# Patient Record
Sex: Female | Born: 1990 | Race: White | Hispanic: No | Marital: Married | Smoking: Never smoker
Health system: Southern US, Community
[De-identification: ages and names within clinical notes are randomized; demographics above are authoritative.]

## PROBLEM LIST (undated history)

## (undated) ENCOUNTER — Inpatient Hospital Stay (HOSPITAL_COMMUNITY): Payer: Self-pay

## (undated) ENCOUNTER — Inpatient Hospital Stay: Payer: Self-pay

## (undated) DIAGNOSIS — N390 Urinary tract infection, site not specified: Secondary | ICD-10-CM

## (undated) DIAGNOSIS — R51 Headache: Secondary | ICD-10-CM

## (undated) DIAGNOSIS — K219 Gastro-esophageal reflux disease without esophagitis: Secondary | ICD-10-CM

## (undated) DIAGNOSIS — N3289 Other specified disorders of bladder: Secondary | ICD-10-CM

## (undated) DIAGNOSIS — K8020 Calculus of gallbladder without cholecystitis without obstruction: Secondary | ICD-10-CM

## (undated) HISTORY — PX: WISDOM TOOTH EXTRACTION: SHX21

## (undated) HISTORY — PX: TONSILLECTOMY: SUR1361

---
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
## 2013-03-19 LAB — OB RESULTS CONSOLE RUBELLA ANTIBODY, IGM: Rubella: IMMUNE

## 2013-03-19 LAB — OB RESULTS CONSOLE ABO/RH: "RH Type ": POSITIVE

## 2013-03-19 LAB — OB RESULTS CONSOLE HEPATITIS B SURFACE ANTIGEN: Hepatitis B Surface Ag: NEGATIVE

## 2013-03-19 LAB — OB RESULTS CONSOLE RPR: RPR: NONREACTIVE

## 2013-03-19 LAB — OB RESULTS CONSOLE ANTIBODY SCREEN: Antibody Screen: NEGATIVE

## 2013-03-19 LAB — OB RESULTS CONSOLE GC/CHLAMYDIA
Chlamydia: NEGATIVE
Gonorrhea: NEGATIVE

## 2013-04-07 ENCOUNTER — Other Ambulatory Visit: Payer: Self-pay | Admitting: Obstetrics and Gynecology

## 2013-07-10 ENCOUNTER — Encounter (HOSPITAL_COMMUNITY): Payer: Self-pay | Admitting: Family

## 2013-07-10 ENCOUNTER — Inpatient Hospital Stay (HOSPITAL_COMMUNITY)
Admission: AD | Admit: 2013-07-10 | Discharge: 2013-07-11 | Disposition: A | Discharge status: Home / Self Care | Payer: 59 | Source: Ambulatory Visit | Attending: Obstetrics and Gynecology | Admitting: Obstetrics and Gynecology

## 2013-07-10 DIAGNOSIS — Z209 Contact with and (suspected) exposure to unspecified communicable disease: Secondary | ICD-10-CM

## 2013-07-10 DIAGNOSIS — R1011 Right upper quadrant pain: Secondary | ICD-10-CM | POA: Insufficient documentation

## 2013-07-10 DIAGNOSIS — O99891 Other specified diseases and conditions complicating pregnancy: Secondary | ICD-10-CM | POA: Insufficient documentation

## 2013-07-10 DIAGNOSIS — R51 Headache: Secondary | ICD-10-CM | POA: Insufficient documentation

## 2013-07-10 DIAGNOSIS — Z2089 Contact with and (suspected) exposure to other communicable diseases: Secondary | ICD-10-CM | POA: Insufficient documentation

## 2013-07-10 HISTORY — DX: Urinary tract infection, site not specified: N39.0

## 2013-07-10 HISTORY — DX: Other specified disorders of bladder: N32.89

## 2013-07-10 LAB — COMPREHENSIVE METABOLIC PANEL
ALT: 9 U/L (ref 0–35)
AST: 13 U/L (ref 0–37)
CO2: 23 mEq/L (ref 19–32)
Chloride: 104 mEq/L (ref 96–112)
GFR calc Af Amer: >90 mL/min (ref >90)
GFR calc non Af Amer: >90 mL/min (ref >90)
Glucose, Bld: 98 mg/dL (ref 70–99)
Sodium: 138 mEq/L (ref 135–145)
Total Bilirubin: 0.1 mg/dL — ABNORMAL LOW (ref 0.3–1.2)

## 2013-07-10 LAB — CBC
Hemoglobin: 11.1 g/dL — ABNORMAL LOW (ref 12.0–15.0)
MCV: 94.5 fL (ref 78.0–100.0)
Platelets: 179 10*3/uL (ref 150–400)
RBC: 3.28 MIL/uL — ABNORMAL LOW (ref 3.87–5.11)
WBC: 9.4 10*3/uL (ref 4.0–10.5)

## 2013-07-10 LAB — URINALYSIS, ROUTINE W REFLEX MICROSCOPIC
Bilirubin Urine: NEGATIVE
Glucose, UA: NEGATIVE mg/dL
Hgb urine dipstick: NEGATIVE
Specific Gravity, Urine: >1.03 — ABNORMAL HIGH (ref 1.005–1.030)
pH: 6 (ref 5.0–8.0)

## 2013-07-10 NOTE — MAU Provider Note (Signed)
Chief Complaint:  Body Fluid Exposure and Abdominal Pain   First Provider Initiated Contact with Patient 07/10/13 2301      HPI: Melinda Evans is a 22 y.o. G1P0 at [redacted]w[redacted]d who presents to maternity admissions reporting: 1. Sharp right upper quadrant pain over the last week, worsening. 2. Throbbing generalized headache. 3. Direct, close exposure to child with hand-foot-mouth disease 2 days ago.  Child had fever but no lesions at the time of exposure. 4. Not feeling well over the past week to week and a half. Had nausea and vomiting last week, vomiting resolve. Mild nausea persists.  Denies fever, chills, rash, vision changes, contractions, leakage of fluid or vaginal bleeding. Good fetal movement. No history of hypertension for during pregnancy. Patient thinks she might have been told that she had gallbladder problems in the past.  Past Medical History: Past Medical History  Diagnosis Date  . UTI (lower urinary tract infection)   . Bladder spasms     Past obstetric history: OB History   Grav Para Term Preterm Abortions TAB SAB Ect Mult Living   1              # Outc Date GA Lbr Len/2nd Wgt Sex Del Anes PTL Lv   1 CUR               Past Surgical History: Past Surgical History  Procedure Laterality Date  . Tonsillectomy      Family History: History reviewed. No pertinent family history.  Social History: History  Substance Use Topics  . Smoking status: Never Smoker   . Smokeless tobacco: Never Used  . Alcohol Use: No    Allergies: No Known Allergies  Meds:  Prescriptions prior to admission  Medication Sig Dispense Refill  . Prenatal Vit-Fe Fumarate-FA (PRENATAL MULTIVITAMIN) TABS tablet Take 1 tablet by mouth daily at 12 noon.      . [DISCONTINUED] ACETAMINOPHEN-BUTALBITAL 50-325 MG TABS Take by mouth.        ROS: Pertinent findings in history of present illness.  Physical Exam  Blood pressure 110/79, pulse 91, temperature 98.2 F (36.8 C), temperature source  Oral, resp. rate 18, last menstrual period 01/17/2013. GENERAL: Well-developed, well-nourished female in no acute distress. No lesions on hands, feet or mouth. HEENT: normocephalic HEART: normal rate RESP: normal effort ABDOMEN: Soft, non-tender, gravid appropriate for gestational age. Negative Murphy sign. EXTREMITIES: Nontender, no edema NEURO: alert and oriented. Deep tendon reflexes 2+, no clonus SPECULUM EXAM: Deferred   FHT:  Baseline 145 , min-mod variability, accelerations present, no decelerations Contractions: none   Labs: Results for orders placed during the hospital encounter of 07/10/13 (from the past 24 hour(s))  PROTEIN / CREATININE RATIO, URINE     Status: None   Collection Time    07/10/13  8:32 PM      Result Value Range   Creatinine, Urine 133.56     Total Protein, Urine 10.9     PROTEIN CREATININE RATIO 0.08  0.00 - 0.15  URINALYSIS, ROUTINE W REFLEX MICROSCOPIC     Status: Abnormal   Collection Time    07/10/13  8:32 PM      Result Value Range   Color, Urine YELLOW  YELLOW   APPearance CLEAR  CLEAR   Specific Gravity, Urine >1.030 (*) 1.005 - 1.030   pH 6.0  5.0 - 8.0   Glucose, UA NEGATIVE  NEGATIVE mg/dL   Hgb urine dipstick NEGATIVE  NEGATIVE   Bilirubin Urine NEGATIVE  NEGATIVE  Ketones, ur NEGATIVE  NEGATIVE mg/dL   Protein, ur NEGATIVE  NEGATIVE mg/dL   Urobilinogen, UA 0.2  0.0 - 1.0 mg/dL   Nitrite NEGATIVE  NEGATIVE   Leukocytes, UA NEGATIVE  NEGATIVE  CBC     Status: Abnormal   Collection Time    07/10/13  9:25 PM      Result Value Range   WBC 9.4  4.0 - 10.5 K/uL   RBC 3.28 (*) 3.87 - 5.11 MIL/uL   Hemoglobin 11.1 (*) 12.0 - 15.0 g/dL   HCT 16.1 (*) 09.6 - 04.5 %   MCV 94.5  78.0 - 100.0 fL   MCH 33.8  26.0 - 34.0 pg   MCHC 35.8  30.0 - 36.0 g/dL   RDW 40.9  81.1 - 91.4 %   Platelets 179  150 - 400 K/uL  COMPREHENSIVE METABOLIC PANEL     Status: Abnormal   Collection Time    07/10/13  9:25 PM      Result Value Range   Sodium 138   135 - 145 mEq/L   Potassium 3.3 (*) 3.5 - 5.1 mEq/L   Chloride 104  96 - 112 mEq/L   CO2 23  19 - 32 mEq/L   Glucose, Bld 98  70 - 99 mg/dL   BUN 6  6 - 23 mg/dL   Creatinine, Ser 7.82 (*) 0.50 - 1.10 mg/dL   Calcium 9.0  8.4 - 95.6 mg/dL   Total Protein 6.0  6.0 - 8.3 g/dL   Albumin 2.8 (*) 3.5 - 5.2 g/dL   AST 13  0 - 37 U/L   ALT 9  0 - 35 U/L   Alkaline Phosphatase 68  39 - 117 U/L   Total Bilirubin 0.1 (*) 0.3 - 1.2 mg/dL   GFR calc non Af Amer >90  >90 mL/min   GFR calc Af Amer >90  >90 mL/min    Imaging:  N/A  MAU Course: Notified Dr. Huntley Dec of headache, epigastric pain, normal blood pressures, normal PIH labs and coxsackie virus exposure without symptoms.   The pain much better after Percocet. Nausea resolved with Zofran. No imaging needed at this time per Dr. Huntley Dec.  Assessment: 1. Other current maternal conditions classifiable elsewhere, antepartum   2. Infectious disease exposure   3. RUQ pain     Plan: Discharge home Preterm labor precautions, PIH precautions  and fetal kick counts. Coxsackievirus antibodies drawn. Strongly encouraged avoiding contact with sick child or other pregnant women until coxsackievirus virus antibody results are back. Your Dr. will be able to look up the results and determine if any further testing as needed.      Follow-up Information   Follow up with [facility] II,JAMES E, MD. (as scheduled or as needed if symptoms worsen)    Contact information:   [address] [place] [address] [phone]       Follow up with [facility] [place] MATERNITY ADMISSIONS. (As needed if symptoms worsen)    Contact information:   [address] [id number] [place] [address] [phone]       Medication List    STOP taking these medications       ACETAMINOPHEN-BUTALBITAL 50-325 MG Tabs      TAKE these medications       ondansetron 8 MG disintegrating tablet  Commonly known as:  ZOFRAN  ODT  Take 1 tablet (8 mg total) by mouth every 8 (eight) hours as needed for nausea.  oxyCODONE-acetaminophen 5-325 MG per tablet  Commonly known as:  PERCOCET/ROXICET  Take 1-2 tablets by mouth every 4 (four) hours as needed for pain.     prenatal multivitamin Tabs tablet  Take 1 tablet by mouth daily at 12 noon.       [place], CNM 07/11/2013 1:15 AM

## 2013-07-10 NOTE — MAU Note (Signed)
Patient presents to MAU with c/o possible exposure to hand-foot-mouth disease. Patient reports she has a relative diagnosed with hand-foot-mouth on Tuesday with whom she visited with that day.  Patient also reports persistent stabbing RUQ pain, headaches and ; advised by call RN at office to come in for evaluation. Denies visual disturbances.  Reports +FM; denies VB, LOF or contractions.

## 2013-07-11 MED ORDER — ONDANSETRON 8 MG PO TBDP: 8.0000 mg | ORAL_TABLET | Freq: Three times a day (TID) | ORAL | Status: DC | PRN

## 2013-07-11 MED ORDER — OXYCODONE-ACETAMINOPHEN 5-325 MG PO TABS
2.0000 | ORAL_TABLET | Freq: Once | ORAL | Status: AC
  Administered 2013-07-11: 2 via ORAL
  Filled 2013-07-11: qty 2

## 2013-07-11 MED ORDER — OXYCODONE-ACETAMINOPHEN 5-325 MG PO TABS: 1.0000 | ORAL_TABLET | ORAL | Status: DC | PRN

## 2013-07-15 LAB — COXSACKIE B VIRUS ANTIBODIES
Coxsackie B1 Ab: <1:8 {titer}
Coxsackie B2 Ab: <1:8 {titer}
Coxsackie B3 Ab: 1:8 {titer} — ABNORMAL HIGH
Coxsackie B4 Ab: <1:8 {titer}
Coxsackie B5 Ab: 1:16 {titer} — ABNORMAL HIGH

## 2013-07-30 ENCOUNTER — Other Ambulatory Visit (HOSPITAL_COMMUNITY): Payer: Self-pay | Admitting: Obstetrics and Gynecology

## 2013-07-30 DIAGNOSIS — R1011 Right upper quadrant pain: Secondary | ICD-10-CM

## 2013-07-31 ENCOUNTER — Ambulatory Visit (HOSPITAL_COMMUNITY)
Admission: RE | Admit: 2013-07-31 | Discharge: 2013-07-31 | Disposition: A | Discharge status: Home / Self Care | Payer: 59 | Source: Ambulatory Visit | Attending: Obstetrics and Gynecology | Admitting: Obstetrics and Gynecology

## 2013-07-31 DIAGNOSIS — R1011 Right upper quadrant pain: Secondary | ICD-10-CM

## 2013-07-31 DIAGNOSIS — R109 Unspecified abdominal pain: Secondary | ICD-10-CM | POA: Insufficient documentation

## 2013-07-31 DIAGNOSIS — O99891 Other specified diseases and conditions complicating pregnancy: Secondary | ICD-10-CM | POA: Insufficient documentation

## 2013-08-13 ENCOUNTER — Ambulatory Visit (INDEPENDENT_AMBULATORY_CARE_PROVIDER_SITE_OTHER): Payer: Self-pay | Admitting: General Surgery

## 2013-08-13 ENCOUNTER — Telehealth (INDEPENDENT_AMBULATORY_CARE_PROVIDER_SITE_OTHER): Payer: Self-pay | Admitting: General Surgery

## 2013-08-13 NOTE — Telephone Encounter (Signed)
Left call back number to be sent to pager at 3:24 on 08/13/13.Marland Kitchenask for [employer]

## 2013-08-13 NOTE — Telephone Encounter (Signed)
Tried calling patient 9/10@10 :16 to see why she was a no show but her only number that we have is a cell and the VM box is full so I couldn't leave a message.Marland KitchenMarland KitchenI will try calling the patient one more time this p.m. To see if I can get and answer and possibly get her in tomorrow in one of the open [place] slots per EW.Marland Kitchenafter looking at the notes from the referring doctor they wrote her appt down for 9/12@9 :45 so if I am unsuccessful at getting in touch with her I have a feeling she will probably show up them which EW is not in the office

## 2013-08-15 ENCOUNTER — Ambulatory Visit (INDEPENDENT_AMBULATORY_CARE_PROVIDER_SITE_OTHER): Payer: 59 | Admitting: General Surgery

## 2013-08-15 ENCOUNTER — Encounter (INDEPENDENT_AMBULATORY_CARE_PROVIDER_SITE_OTHER): Payer: Self-pay | Admitting: General Surgery

## 2013-08-15 VITALS — BP 108/64 | HR 92 | Resp 14 | Ht 62.0 in | Wt 151.8 lb

## 2013-08-15 DIAGNOSIS — R1011 Right upper quadrant pain: Secondary | ICD-10-CM

## 2013-08-15 NOTE — Progress Notes (Signed)
Subjective:     Patient ID: Melinda Evans, female   DOB: 1991/05/06, 22 y.o.   MRN: [id number]  HPI The patient is a 22 year old female who is referred by Dr. Jannet Mantis for evaluation of gallbladder issues. The patient states that approximately 20 weeks she began having right upper quadrant pain. The patient states that this is not particularly associated with meals. She does state the pain sometimes occur throughout the day and is worse at night when lying down. The patient also states she has a sensation of food getting stuck in her esophagus. She does state she's taking reflux medication at this time.  The patient also has undergone ultrasound which Reveals no gallstones, and otherwise no signs of chronic cholecystitis or acute cholecystitis. The patient's LFTs were within normal limits.  Review of Systems  Constitutional: Negative.   HENT: Negative.   Respiratory: Negative.   Cardiovascular: Negative.   Gastrointestinal: Negative.   Neurological: Negative.   All other systems reviewed and are negative.       Objective:   Physical Exam  Constitutional: She is oriented to person, place, and time. She appears well-developed and well-nourished.  HENT:  Head: Normocephalic and atraumatic.  Eyes: Conjunctivae and EOM are normal. Pupils are equal, round, and reactive to light.  Neck: Normal range of motion. Neck supple.  Cardiovascular: Normal rate, regular rhythm and normal heart sounds.   Pulmonary/Chest: Effort normal and breath sounds normal.  Abdominal: Soft. Bowel sounds are normal. There is tenderness (RUQ). There is no rebound and no guarding.  Musculoskeletal: Normal range of motion.  Neurological: She is alert and oriented to person, place, and time.       Assessment:     22 year old female 30 weeks gravid uterus.     Plan:     1. Discussed with patient symptoms of right upper quadrant pain likely related to her gravid uterus. Not so much that the fetus is pushing on her  ribs but more so that the uterus is portion of other organs. This may explain why in line down she feels more pain and right upper quadrant. 2. I discussed with her to eat more small frequent meals to help with the sensation of dysphagia.  He wished to continue with her PPIs at this time. 3. The patient in followup after delivery should her symptoms continue. At that time we would work her up for possible biliary dyskinesia and she will get a HIDA scan.

## 2013-08-25 ENCOUNTER — Encounter (INDEPENDENT_AMBULATORY_CARE_PROVIDER_SITE_OTHER): Payer: Self-pay

## 2013-09-29 ENCOUNTER — Inpatient Hospital Stay (HOSPITAL_COMMUNITY)
Admission: AD | Admit: 2013-09-29 | Discharge: 2013-09-29 | Disposition: A | Discharge status: Home / Self Care | Payer: 59 | Source: Ambulatory Visit | Attending: Obstetrics and Gynecology | Admitting: Obstetrics and Gynecology

## 2013-09-29 ENCOUNTER — Encounter (HOSPITAL_COMMUNITY): Payer: Self-pay | Admitting: *Deleted

## 2013-09-29 DIAGNOSIS — R109 Unspecified abdominal pain: Secondary | ICD-10-CM | POA: Insufficient documentation

## 2013-09-29 DIAGNOSIS — M549 Dorsalgia, unspecified: Secondary | ICD-10-CM | POA: Insufficient documentation

## 2013-09-29 DIAGNOSIS — O4700 False labor before 37 completed weeks of gestation, unspecified trimester: Secondary | ICD-10-CM | POA: Insufficient documentation

## 2013-09-29 HISTORY — DX: Calculus of gallbladder without cholecystitis without obstruction: K80.20

## 2013-09-29 LAB — URINALYSIS, ROUTINE W REFLEX MICROSCOPIC
Bilirubin Urine: NEGATIVE
Hgb urine dipstick: NEGATIVE
Ketones, ur: NEGATIVE mg/dL
Specific Gravity, Urine: 1.02 (ref 1.005–1.030)
Urobilinogen, UA: 0.2 mg/dL (ref 0.0–1.0)

## 2013-09-29 NOTE — MAU Provider Note (Signed)
Melinda Evans 22 y.o. G1P0 presents to MAU for what she thinks is ROM. Physical exam is difficult as patient is having much pressure. No pooling of fluids, cervix appears closed visually. Cervix is very posterior and patient is very uncomfortable.

## 2013-09-29 NOTE — MAU Note (Signed)
Patient states she has passed her mucus plug on 2023/10/18. States she has been having back and abdominal pain and pressure. Today at 1530 states she had clear fluid leaking down her legs. Has changed panties prior to coming to MAU. Reports good fetal movement. Having contractions about every 6-10 minutes.

## 2013-10-09 ENCOUNTER — Encounter (HOSPITAL_COMMUNITY): Payer: Self-pay | Admitting: *Deleted

## 2013-10-09 ENCOUNTER — Inpatient Hospital Stay (HOSPITAL_COMMUNITY)
Admission: AD | Admit: 2013-10-09 | Discharge: 2013-10-10 | Disposition: A | Discharge status: Home / Self Care | Payer: 59 | Source: Ambulatory Visit | Attending: Obstetrics and Gynecology | Admitting: Obstetrics and Gynecology

## 2013-10-09 DIAGNOSIS — O479 False labor, unspecified: Secondary | ICD-10-CM | POA: Insufficient documentation

## 2013-10-09 NOTE — Progress Notes (Signed)
Plan of care d/w Dr. Marcelle Overlie. Have pt walk for 1-2 hours and return to MAU. If no cervical change pt can be discharged home. Ambien if pt desires,  if discharged.

## 2013-10-09 NOTE — MAU Note (Signed)
Pt reports uc's q 3 minutes for 3 hours

## 2013-10-10 MED ORDER — ZOLPIDEM TARTRATE 5 MG PO TABS
5.0000 mg | ORAL_TABLET | Freq: Once | ORAL | Status: AC
  Administered 2013-10-10: 5 mg via ORAL
  Filled 2013-10-10: qty 1

## 2013-10-17 ENCOUNTER — Encounter (INDEPENDENT_AMBULATORY_CARE_PROVIDER_SITE_OTHER): Payer: Self-pay

## 2013-10-17 LAB — OB RESULTS CONSOLE GBS: GBS: NEGATIVE

## 2013-10-20 ENCOUNTER — Encounter (HOSPITAL_COMMUNITY): Payer: Self-pay | Admitting: *Deleted

## 2013-10-20 ENCOUNTER — Inpatient Hospital Stay (HOSPITAL_COMMUNITY)
Admission: AD | Admit: 2013-10-20 | Discharge: 2013-10-20 | Disposition: A | Discharge status: Home / Self Care | Payer: 59 | Source: Ambulatory Visit | Attending: Obstetrics and Gynecology | Admitting: Obstetrics and Gynecology

## 2013-10-20 DIAGNOSIS — O479 False labor, unspecified: Secondary | ICD-10-CM | POA: Insufficient documentation

## 2013-10-20 HISTORY — DX: Gastro-esophageal reflux disease without esophagitis: K21.9

## 2013-10-20 HISTORY — DX: Headache: R51

## 2013-10-21 ENCOUNTER — Encounter (HOSPITAL_COMMUNITY): Payer: Self-pay | Admitting: *Deleted

## 2013-10-21 ENCOUNTER — Inpatient Hospital Stay (HOSPITAL_COMMUNITY)
Admission: AD | Admit: 2013-10-21 | Discharge: 2013-10-23 | DRG: 775 | Disposition: A | Discharge status: Home / Self Care | Payer: 59 | Source: Ambulatory Visit | Attending: Obstetrics and Gynecology | Admitting: Obstetrics and Gynecology

## 2013-10-21 ENCOUNTER — Encounter (HOSPITAL_COMMUNITY): Payer: 59 | Admitting: Anesthesiology

## 2013-10-21 ENCOUNTER — Inpatient Hospital Stay (HOSPITAL_COMMUNITY): Payer: 59 | Admitting: Anesthesiology

## 2013-10-21 DIAGNOSIS — O99891 Other specified diseases and conditions complicating pregnancy: Secondary | ICD-10-CM | POA: Diagnosis present

## 2013-10-21 LAB — CBC
HCT: 33.8 % — ABNORMAL LOW (ref 36.0–46.0)
Hemoglobin: 11.7 g/dL — ABNORMAL LOW (ref 12.0–15.0)
MCH: 31 pg (ref 26.0–34.0)
MCV: 89.7 fL (ref 78.0–100.0)
RBC: 3.77 MIL/uL — ABNORMAL LOW (ref 3.87–5.11)

## 2013-10-21 MED ORDER — OXYTOCIN 40 UNITS IN LACTATED RINGERS INFUSION - SIMPLE MED
1.0000 m[IU]/min | INTRAVENOUS | Status: DC
  Administered 2013-10-21: 2 m[IU]/min via INTRAVENOUS

## 2013-10-21 MED ORDER — ZOLPIDEM TARTRATE 5 MG PO TABS: 5.0000 mg | ORAL_TABLET | Freq: Every evening | ORAL | Status: DC | PRN

## 2013-10-21 MED ORDER — ONDANSETRON HCL 4 MG/2ML IJ SOLN
4.0000 mg | Freq: Four times a day (QID) | INTRAMUSCULAR | Status: DC | PRN
  Administered 2013-10-21: 4 mg via INTRAVENOUS
  Filled 2013-10-21: qty 2

## 2013-10-21 MED ORDER — TETANUS-DIPHTH-ACELL PERTUSSIS 5-2.5-18.5 LF-MCG/0.5 IM SUSP: 0.5000 mL | Freq: Once | INTRAMUSCULAR | Status: DC

## 2013-10-21 MED ORDER — IBUPROFEN 600 MG PO TABS
600.0000 mg | ORAL_TABLET | Freq: Four times a day (QID) | ORAL | Status: DC
  Administered 2013-10-22 – 2013-10-23 (×7): 600 mg via ORAL
  Filled 2013-10-21 (×7): qty 1

## 2013-10-21 MED ORDER — FENTANYL 2.5 MCG/ML BUPIVACAINE 1/10 % EPIDURAL INFUSION (WH - ANES)
14.0000 mL/h | INTRAMUSCULAR | Status: DC | PRN
  Administered 2013-10-21: 14 mL/h via EPIDURAL
  Filled 2013-10-21 (×2): qty 125

## 2013-10-21 MED ORDER — LACTATED RINGERS IV SOLN: 500.0000 mL | Freq: Once | INTRAVENOUS | Status: DC

## 2013-10-21 MED ORDER — LACTATED RINGERS IV SOLN: INTRAVENOUS | Status: DC

## 2013-10-21 MED ORDER — OXYCODONE-ACETAMINOPHEN 5-325 MG PO TABS
1.0000 | ORAL_TABLET | ORAL | Status: DC | PRN
  Administered 2013-10-21 – 2013-10-22 (×7): 1 via ORAL
  Administered 2013-10-23: 2 via ORAL
  Administered 2013-10-23 (×2): 1 via ORAL
  Administered 2013-10-23: 2 via ORAL
  Filled 2013-10-21 (×4): qty 1
  Filled 2013-10-21: qty 2
  Filled 2013-10-21 (×3): qty 1
  Filled 2013-10-21: qty 2
  Filled 2013-10-21 (×2): qty 1

## 2013-10-21 MED ORDER — ONDANSETRON HCL 4 MG/2ML IJ SOLN: 4.0000 mg | INTRAMUSCULAR | Status: DC | PRN

## 2013-10-21 MED ORDER — DIPHENHYDRAMINE HCL 50 MG/ML IJ SOLN
12.5000 mg | INTRAMUSCULAR | Status: DC | PRN
  Administered 2013-10-21: 12.5 mg via INTRAVENOUS
  Filled 2013-10-21: qty 1

## 2013-10-21 MED ORDER — ONDANSETRON HCL 4 MG PO TABS: 4.0000 mg | ORAL_TABLET | ORAL | Status: DC | PRN

## 2013-10-21 MED ORDER — EPHEDRINE 5 MG/ML INJ
10.0000 mg | INTRAVENOUS | Status: DC | PRN
  Filled 2013-10-21: qty 2

## 2013-10-21 MED ORDER — DIBUCAINE 1 % RE OINT
1.0000 "application " | TOPICAL_OINTMENT | RECTAL | Status: DC | PRN
  Administered 2013-10-22: 1 via RECTAL
  Filled 2013-10-21: qty 28

## 2013-10-21 MED ORDER — WITCH HAZEL-GLYCERIN EX PADS: 1.0000 "application " | MEDICATED_PAD | CUTANEOUS | Status: DC | PRN

## 2013-10-21 MED ORDER — OXYTOCIN 40 UNITS IN LACTATED RINGERS INFUSION - SIMPLE MED
62.5000 mL/h | INTRAVENOUS | Status: DC
  Administered 2013-10-21: 62.5 mL/h via INTRAVENOUS
  Filled 2013-10-21: qty 1000

## 2013-10-21 MED ORDER — SIMETHICONE 80 MG PO CHEW: 80.0000 mg | CHEWABLE_TABLET | ORAL | Status: DC | PRN

## 2013-10-21 MED ORDER — SENNOSIDES-DOCUSATE SODIUM 8.6-50 MG PO TABS
2.0000 | ORAL_TABLET | ORAL | Status: DC
  Administered 2013-10-22 (×2): 2 via ORAL
  Filled 2013-10-21 (×2): qty 2

## 2013-10-21 MED ORDER — PHENYLEPHRINE 40 MCG/ML (10ML) SYRINGE FOR IV PUSH (FOR BLOOD PRESSURE SUPPORT)
80.0000 ug | PREFILLED_SYRINGE | INTRAVENOUS | Status: DC | PRN
  Filled 2013-10-21: qty 2
  Filled 2013-10-21: qty 10

## 2013-10-21 MED ORDER — BISACODYL 10 MG RE SUPP: 10.0000 mg | Freq: Every day | RECTAL | Status: DC | PRN

## 2013-10-21 MED ORDER — PRENATAL MULTIVITAMIN CH
1.0000 | ORAL_TABLET | Freq: Every day | ORAL | Status: DC
  Administered 2013-10-22 – 2013-10-23 (×2): 1 via ORAL
  Filled 2013-10-21 (×2): qty 1

## 2013-10-21 MED ORDER — PHENYLEPHRINE 40 MCG/ML (10ML) SYRINGE FOR IV PUSH (FOR BLOOD PRESSURE SUPPORT)
80.0000 ug | PREFILLED_SYRINGE | INTRAVENOUS | Status: DC | PRN
  Filled 2013-10-21: qty 2

## 2013-10-21 MED ORDER — LANOLIN HYDROUS EX OINT: TOPICAL_OINTMENT | CUTANEOUS | Status: DC | PRN

## 2013-10-21 MED ORDER — FENTANYL 2.5 MCG/ML BUPIVACAINE 1/10 % EPIDURAL INFUSION (WH - ANES)
INTRAMUSCULAR | Status: DC | PRN
  Administered 2013-10-21: 14 mL/h via EPIDURAL

## 2013-10-21 MED ORDER — LIDOCAINE HCL (PF) 1 % IJ SOLN
INTRAMUSCULAR | Status: DC | PRN
  Administered 2013-10-21 (×2): 9 mL

## 2013-10-21 MED ORDER — DIPHENHYDRAMINE HCL 25 MG PO CAPS: 25.0000 mg | ORAL_CAPSULE | Freq: Four times a day (QID) | ORAL | Status: DC | PRN

## 2013-10-21 MED ORDER — IBUPROFEN 600 MG PO TABS
600.0000 mg | ORAL_TABLET | Freq: Four times a day (QID) | ORAL | Status: DC | PRN
  Administered 2013-10-21: 600 mg via ORAL
  Filled 2013-10-21: qty 1

## 2013-10-21 MED ORDER — FLEET ENEMA 7-19 GM/118ML RE ENEM: 1.0000 | ENEMA | Freq: Every day | RECTAL | Status: DC | PRN

## 2013-10-21 MED ORDER — FLEET ENEMA 7-19 GM/118ML RE ENEM: 1.0000 | ENEMA | RECTAL | Status: DC | PRN

## 2013-10-21 MED ORDER — EPHEDRINE 5 MG/ML INJ
10.0000 mg | INTRAVENOUS | Status: DC | PRN
  Filled 2013-10-21: qty 2
  Filled 2013-10-21: qty 4

## 2013-10-21 MED ORDER — OXYTOCIN BOLUS FROM INFUSION: 500.0000 mL | INTRAVENOUS | Status: DC

## 2013-10-21 MED ORDER — BENZOCAINE-MENTHOL 20-0.5 % EX AERO
1.0000 "application " | INHALATION_SPRAY | CUTANEOUS | Status: DC | PRN
  Administered 2013-10-21: 1 via TOPICAL
  Filled 2013-10-21: qty 56

## 2013-10-21 MED ORDER — LACTATED RINGERS IV SOLN: 500.0000 mL | INTRAVENOUS | Status: DC | PRN

## 2013-10-21 MED ORDER — OXYCODONE-ACETAMINOPHEN 5-325 MG PO TABS: 1.0000 | ORAL_TABLET | ORAL | Status: DC | PRN

## 2013-10-21 MED ORDER — LIDOCAINE HCL (PF) 1 % IJ SOLN
30.0000 mL | INTRAMUSCULAR | Status: DC | PRN
  Filled 2013-10-21 (×2): qty 30

## 2013-10-21 MED ORDER — CITRIC ACID-SODIUM CITRATE 334-500 MG/5ML PO SOLN: 30.0000 mL | ORAL | Status: DC | PRN

## 2013-10-21 MED ORDER — TERBUTALINE SULFATE 1 MG/ML IJ SOLN: 0.2500 mg | Freq: Once | INTRAMUSCULAR | Status: DC | PRN

## 2013-10-21 MED ORDER — ACETAMINOPHEN 325 MG PO TABS: 650.0000 mg | ORAL_TABLET | ORAL | Status: DC | PRN

## 2013-10-21 NOTE — H&P (Signed)
Melinda Evans is a 22 y.o. female presenting for UCs and a question of SROM last night. Has had some RUQ pain on/off in pregnancy with normal labs and normal RUQ U/S. No HA, no vision change. Maternal Medical History:  Reason for admission: Contractions.   Contractions: Onset was 3-5 hours ago.    Fetal activity: Perceived fetal activity is normal.      OB History   Grav Para Term Preterm Abortions TAB SAB Ect Mult Living   1         0     Past Medical History  Diagnosis Date  . UTI (lower urinary tract infection)   . Bladder spasms   . Gall stones   . Headache(784.0)   . GERD (gastroesophageal reflux disease)    Past Surgical History  Procedure Laterality Date  . Tonsillectomy    . Wisdom tooth extraction     Family History: family history includes Diabetes in her maternal grandmother and mother; Heart disease in her maternal grandfather; Hypertension in her maternal grandmother and paternal aunt. Social History:  reports that she has never smoked. She has never used smokeless tobacco. She reports that she does not drink alcohol or use illicit drugs.   Prenatal Transfer Tool  Maternal Diabetes: No Genetic Screening: Normal Maternal Ultrasounds/Referrals: Normal Fetal Ultrasounds or other Referrals:  None Maternal Substance Abuse:  No Significant Maternal Medications:  None Significant Maternal Lab Results:  None Other Comments:  None  Review of Systems  Eyes: Negative for blurred vision.  Gastrointestinal: Negative for abdominal pain.  Neurological: Negative for headaches.    Dilation: 4.5 Effacement (%): 90 Station: -1 Exam by:: Dr. Henderson Cloud Blood pressure 126/89, pulse 83, temperature 97.7 F (36.5 C), temperature source Oral, resp. rate 20, height 5' 2.25" (1.581 m), weight 160 lb (72.576 kg), last menstrual period 01/17/2013, SpO2 98.00%. Maternal Exam:  Uterine Assessment: Contraction strength is firm.  Contraction frequency is regular.   Abdomen: Fetal  presentation: vertex     Fetal Exam Fetal State Assessment: Category I - tracings are normal.     Physical Exam  Cardiovascular: Normal rate and regular rhythm.   Respiratory: Effort normal.  GI: Soft. There is no tenderness.  Neurological: She has normal reflexes.    Prenatal labs: ABO, Rh: O/Positive/-- (04/16 0000) Antibody: Negative (04/16 0000) Rubella: Immune (04/16 0000) RPR: Nonreactive (04/16 0000)  HBsAg: Negative (04/16 0000)  HIV: Non-reactive (04/16 0000)  GBS: Negative (11/14 0000)   Assessment/Plan: 22 yo G1P0 at 39 4/7 weeks in active labor Recheck in 1-2 hours D/W patient and her mother above, all questions answered   Jermaine Neuharth II,Ahmeer Tuman E 10/21/2013, 7:57 AM

## 2013-10-21 NOTE — Anesthesia Procedure Notes (Signed)
Epidural Patient location during procedure: OB Start time: 10/21/2013 6:04 AM End time: 10/21/2013 6:08 AM  Staffing Anesthesiologist: Leilani Able Performed by: anesthesiologist   Preanesthetic Checklist Completed: patient identified, surgical consent, pre-op evaluation, timeout performed, IV checked, risks and benefits discussed and monitors and equipment checked  Epidural Patient position: sitting Prep: site prepped and draped and DuraPrep Patient monitoring: continuous pulse ox and blood pressure Approach: midline Injection technique: LOR air  Needle:  Needle type: Tuohy  Needle gauge: 17 G Needle length: 9 cm and 9 Needle insertion depth: 6 cm Catheter type: closed end flexible Catheter size: 19 Gauge Catheter at skin depth: 11 cm Test dose: negative and Other  Assessment Sensory level: T9 Events: blood not aspirated, injection not painful, no injection resistance, negative IV test and no paresthesia  Additional Notes Reason for block:procedure for pain

## 2013-10-21 NOTE — Progress Notes (Signed)
Cx 7/C/-2/caput FHT + accels, good response to scalp stim UCs q2-4 min

## 2013-10-21 NOTE — Progress Notes (Signed)
Delivery of live viable female by Dr Henderson Cloud, Melvyn Novas 9,9

## 2013-10-21 NOTE — Progress Notes (Signed)
Delivery Note At 6:25 PM a viable female was delivered via Vaginal, Spontaneous Delivery (Presentation: Left Occiput Anterior).  APGAR: 9, 9; weight .   Placenta status: Intact, Spontaneous.  Cord: 3 vessels with the following complications: None.  Cord pH: pending  Anesthesia: Epidural  Episiotomy: None Lacerations: bilat 2nd degree;Perineal Suture Repair: vicryl rapide Est. Blood Loss (mL): 300  Mom to postpartum.  Baby to Couplet care / Skin to Skin.  Clearance Chenault II,May Manrique E 10/21/2013, 6:47 PM

## 2013-10-21 NOTE — Anesthesia Preprocedure Evaluation (Signed)
Anesthesia Evaluation  Patient identified by MRN, date of birth, ID band Patient awake    Reviewed: Allergy & Precautions, H&P , NPO status , Patient's Chart, lab work & pertinent test results  Airway Mallampati: I TM Distance: >3 FB Neck ROM: full    Dental no notable dental hx.    Pulmonary neg pulmonary ROS,    Pulmonary exam normal       Cardiovascular negative cardio ROS      Neuro/Psych negative psych ROS   GI/Hepatic Neg liver ROS,   Endo/Other  negative endocrine ROS  Renal/GU negative Renal ROS     Musculoskeletal   Abdominal Normal abdominal exam  (+)   Peds  Hematology negative hematology ROS (+)   Anesthesia Other Findings   Reproductive/Obstetrics (+) Pregnancy                           Anesthesia Physical Anesthesia Plan  ASA: II  Anesthesia Plan: Epidural   Post-op Pain Management:    Induction:   Airway Management Planned:   Additional Equipment:   Intra-op Plan:   Post-operative Plan:   Informed Consent: I have reviewed the patients History and Physical, chart, labs and discussed the procedure including the risks, benefits and alternatives for the proposed anesthesia with the patient or authorized representative who has indicated his/her understanding and acceptance.     Plan Discussed with:   Anesthesia Plan Comments:         Anesthesia Quick Evaluation

## 2013-10-21 NOTE — MAU Note (Signed)
PT SAYS SHE WAS HERE  AT 9PM- SENT HOME  - VE  4 CM.   AT HOME COULD NOT SLEEP.   DENIES HSV AND MRSA.

## 2013-10-21 NOTE — Progress Notes (Signed)
Cx no change IUPC placed Will begin pitocin augmentation D/W patient risks including fetal distress and emergent c/s All questions answered

## 2013-10-22 LAB — CBC
HCT: 31.4 % — ABNORMAL LOW (ref 36.0–46.0)
Hemoglobin: 10.7 g/dL — ABNORMAL LOW (ref 12.0–15.0)
MCH: 30.7 pg (ref 26.0–34.0)
MCHC: 34.1 g/dL (ref 30.0–36.0)
RBC: 3.48 MIL/uL — ABNORMAL LOW (ref 3.87–5.11)
RDW: 13.5 % (ref 11.5–15.5)

## 2013-10-22 NOTE — Anesthesia Postprocedure Evaluation (Signed)
  Anesthesia Post-op Note  Patient: Melinda Evans  Procedure(s) Performed: * No procedures listed *  Patient Location: Mother/Baby  Anesthesia Type:Epidural  Level of Consciousness: awake  Airway and Oxygen Therapy: Patient Spontanous Breathing  Post-op Pain: none  Post-op Assessment: Patient's Cardiovascular Status Stable, Respiratory Function Stable, Patent Airway, No signs of Nausea or vomiting, Adequate PO intake, Pain level controlled, No headache, No backache, No residual numbness and No residual motor weakness  Post-op Vital Signs: Reviewed and stable  Complications: No apparent anesthesia complications

## 2013-10-22 NOTE — Lactation Note (Addendum)
This note was copied from the chart of Melinda Reet Scharrer. Lactation Consultation Note    Initial consult with this first time mom and baby. I assisted mom with football hold, and Lily latched easily. She was sleepy, and suckled softly for 10 minutes, and then fell asleep. I woke her up to do teaching with mom, so she was no showing cues prior to latch. Teaching done with mom from the Baby and Me book, and lactation services reviewed with mom also. The baby is just 15 hours old, and has had multiple wet and dirty diapers, and is transitioning her stool to green, loose. I noted when the baby elevates her tongue, it is a bowl shape. Her frenulum is visible toward the tip of her tongue, and with extension it does cleft slightly. Mom reports some nipple soreness with latching today, but with this latch, mom reports comfort, and with unjatch, mom nipple appears the same as before latch (no pinching). Mom aware to watch for deep latch, and report any nipple pain or discomfort.  Mom knows to call for questions/concerns.  Patient Name: Melinda Evans Today's Date: 10/22/2013 Reason for consult: Initial assessment   Maternal Data Formula Feeding for Exclusion: No Infant to breast within first hour of birth: Yes Has patient been taught Hand Expression?: Yes Does the patient have breastfeeding experience prior to this delivery?: No  Feeding Feeding Type: Breast Fed Length of feed: 10 min  LATCH Score/Interventions Latch: Grasps breast easily, tongue down, lips flanged, rhythmical sucking.  Audible Swallowing: None Intervention(s): Skin to skin;Hand expression  Type of Nipple: Everted at rest and after stimulation  Comfort (Breast/Nipple): Soft / non-tender     Hold (Positioning): Assistance needed to correctly position infant at breast and maintain latch. Intervention(s): Breastfeeding basics reviewed;Support Pillows;Position options;Skin to skin  LATCH Score: 7  Lactation Tools  Discussed/Used     Consult Status Consult Status: Follow-up Date: 10/23/13 Follow-up type: In-patient    Alfred Levins 10/22/2013, 4:39 PM

## 2013-10-22 NOTE — Progress Notes (Signed)
Patient was referred for history of depression/anxiety.  * Referral screened out by Clinical Social Worker because none of the following criteria appear to apply:  ~ History of anxiety/depression during this pregnancy, or of post-partum depression.  ~ Diagnosis of anxiety and/or depression within last 3 years  ~ History of depression due to pregnancy loss/loss of child  OR  * Patient's symptoms currently being treated with medication (Xanax, PRN) and/or therapy, per pt.  Please contact the Clinical Social Worker if needs arise, or by the patient's request.

## 2013-10-22 NOTE — Progress Notes (Signed)
Post Partum Day 1 Subjective: no complaints, up ad lib, voiding and tolerating PO  Objective: Blood pressure 117/72, pulse 78, temperature 97.8 F (36.6 C), temperature source Oral, resp. rate 18, height 5' 2.25" (1.581 m), weight 160 lb (72.576 kg), last menstrual period 01/17/2013, SpO2 98.00%, unknown if currently breastfeeding.  Physical Exam:  General: alert and cooperative Lochia: appropriate Uterine Fundus: firm Incision: perineum intact DVT Evaluation: No evidence of DVT seen on physical exam. Negative Homan's sign. No cords or calf tenderness. No significant calf/ankle edema.   Recent Labs  10/21/13 0507 10/22/13 0600  HGB 11.7* 10.7*  HCT 33.8* 31.4*    Assessment/Plan: Plan for discharge tomorrow   LOS: 1 day   Camryn Quesinberry G 10/22/2013, 8:22 AM

## 2013-10-23 MED ORDER — IBUPROFEN 600 MG PO TABS: 600.0000 mg | ORAL_TABLET | Freq: Four times a day (QID) | ORAL | Status: DC

## 2013-10-23 MED ORDER — OXYCODONE-ACETAMINOPHEN 5-325 MG PO TABS
1.0000 | ORAL_TABLET | ORAL | Status: DC | PRN
Start: 2013-10-23 — End: 2015-04-22

## 2013-10-23 NOTE — Discharge Summary (Signed)
Obstetric Discharge Summary Reason for Admission: rupture of membranes Prenatal Procedures: ultrasound Intrapartum Procedures: spontaneous vaginal delivery Postpartum Procedures: none Complications-Operative and Postpartum: 2 degree perineal laceration Hemoglobin  Date Value Range Status  10/22/2013 10.7* 12.0 - 15.0 g/dL Final     HCT  Date Value Range Status  10/22/2013 31.4* 36.0 - 46.0 % Final    Physical Exam:  General: alert and cooperative Lochia: appropriate Uterine Fundus: firm Incision: perineum intact DVT Evaluation: No evidence of DVT seen on physical exam. Negative Homan's sign. No cords or calf tenderness. No significant calf/ankle edema.  Discharge Diagnoses: Term Pregnancy-delivered  Discharge Information: Date: 10/23/2013 Activity: pelvic rest Diet: routine Medications: PNV, Ibuprofen and Percocet Condition: stable Instructions: refer to practice specific booklet Discharge to: home   Newborn Data: Live born female  Birth Weight: 7 lb 11.5 oz (3501 g) APGAR: 9, 9  Home with mother.  Melinda Evans G 10/23/2013, 8:29 AM

## 2014-01-06 ENCOUNTER — Other Ambulatory Visit: Payer: Self-pay | Admitting: Obstetrics and Gynecology

## 2014-10-05 ENCOUNTER — Encounter (HOSPITAL_COMMUNITY): Payer: Self-pay | Admitting: *Deleted

## 2014-11-02 LAB — OB RESULTS CONSOLE RPR: RPR: NONREACTIVE

## 2014-11-02 LAB — OB RESULTS CONSOLE ABO/RH: RH TYPE: POSITIVE

## 2014-11-02 LAB — OB RESULTS CONSOLE ANTIBODY SCREEN: Antibody Screen: NEGATIVE

## 2014-11-02 LAB — OB RESULTS CONSOLE HEPATITIS B SURFACE ANTIGEN: Hepatitis B Surface Ag: NEGATIVE

## 2014-11-02 LAB — OB RESULTS CONSOLE RUBELLA ANTIBODY, IGM: RUBELLA: IMMUNE

## 2014-11-02 LAB — OB RESULTS CONSOLE HIV ANTIBODY (ROUTINE TESTING): HIV: NONREACTIVE

## 2014-11-17 ENCOUNTER — Other Ambulatory Visit: Payer: Self-pay | Admitting: Obstetrics and Gynecology

## 2014-11-17 LAB — OB RESULTS CONSOLE GC/CHLAMYDIA
Chlamydia: NEGATIVE
Gonorrhea: NEGATIVE

## 2014-11-18 LAB — CYTOLOGY - PAP

## 2014-12-04 NOTE — L&D Delivery Note (Cosign Needed)
Delivery Note At 4:35 AM a viable female was delivered via Vaginal, Spontaneous Delivery (Presentation: Left Occiput Anterior) by nurses right before I walked in after being called for stand-by birth for Dr. Henderson Cloudomblin after rapid progression.  APGAR: 9, 9; weight: pending at time of note.  Infant w/ spontaneous cry and respiration placed on mom's abdomen, cord clamped x 2 & cut by FOB.  Placenta status: delivered spontaneously intact.  Cord: 3 vessels with the following complications: None.    Anesthesia: Epidural  Episiotomy: None Lacerations:  intact Suture Repair: n/a Est. Blood Loss (mL):  200ml  Mom to postpartum.  Baby to Couplet care / Skin to Skin.  Marge DuncansBooker, Kimberly Randall 04/25/2015, 4:51 AM

## 2015-04-12 LAB — OB RESULTS CONSOLE GBS: GBS: POSITIVE

## 2015-04-22 ENCOUNTER — Inpatient Hospital Stay (HOSPITAL_COMMUNITY)
Admission: AD | Admit: 2015-04-22 | Discharge: 2015-04-22 | DRG: 780 | Disposition: A | Discharge status: Home / Self Care | Payer: Medicaid Other | Source: Ambulatory Visit | Attending: Obstetrics and Gynecology | Admitting: Obstetrics and Gynecology

## 2015-04-22 ENCOUNTER — Encounter (HOSPITAL_COMMUNITY): Payer: Self-pay | Admitting: *Deleted

## 2015-04-22 ENCOUNTER — Inpatient Hospital Stay (HOSPITAL_COMMUNITY)
Admission: AD | Admit: 2015-04-22 | Discharge: 2015-04-23 | Disposition: A | Discharge status: Home / Self Care | Payer: 59 | Source: Ambulatory Visit | Attending: Obstetrics & Gynecology | Admitting: Obstetrics & Gynecology

## 2015-04-22 DIAGNOSIS — O9989 Other specified diseases and conditions complicating pregnancy, childbirth and the puerperium: Secondary | ICD-10-CM

## 2015-04-22 DIAGNOSIS — Z3A38 38 weeks gestation of pregnancy: Secondary | ICD-10-CM | POA: Diagnosis present

## 2015-04-22 DIAGNOSIS — O368390 Maternal care for abnormalities of the fetal heart rate or rhythm, unspecified trimester, not applicable or unspecified: Secondary | ICD-10-CM | POA: Insufficient documentation

## 2015-04-22 DIAGNOSIS — O471 False labor at or after 37 completed weeks of gestation: Principal | ICD-10-CM | POA: Diagnosis present

## 2015-04-22 LAB — CBC
HEMATOCRIT: 34 % — AB (ref 36.0–46.0)
Hemoglobin: 11.9 g/dL — ABNORMAL LOW (ref 12.0–15.0)
MCH: 32.4 pg (ref 26.0–34.0)
MCHC: 35 g/dL (ref 30.0–36.0)
MCV: 92.6 fL (ref 78.0–100.0)
PLATELETS: 169 10*3/uL (ref 150–400)
RBC: 3.67 MIL/uL — ABNORMAL LOW (ref 3.87–5.11)
RDW: 14 % (ref 11.5–15.5)
WBC: 9.1 10*3/uL (ref 4.0–10.5)

## 2015-04-22 LAB — TYPE AND SCREEN
ABO/RH(D): O POS
Antibody Screen: NEGATIVE

## 2015-04-22 LAB — RPR: RPR Ser Ql: NONREACTIVE

## 2015-04-22 LAB — ABO/RH: ABO/RH(D): O POS

## 2015-04-22 MED ORDER — FLEET ENEMA 7-19 GM/118ML RE ENEM: 1.0000 | ENEMA | RECTAL | Status: DC | PRN

## 2015-04-22 MED ORDER — LIDOCAINE HCL (PF) 1 % IJ SOLN: 30.0000 mL | INTRAMUSCULAR | Status: DC | PRN

## 2015-04-22 MED ORDER — FENTANYL 2.5 MCG/ML BUPIVACAINE 1/10 % EPIDURAL INFUSION (WH - ANES): 14.0000 mL/h | INTRAMUSCULAR | Status: DC | PRN

## 2015-04-22 MED ORDER — OXYTOCIN 40 UNITS IN LACTATED RINGERS INFUSION - SIMPLE MED
62.5000 mL/h | INTRAVENOUS | Status: DC
Start: 2015-04-22 — End: 2015-04-22

## 2015-04-22 MED ORDER — LACTATED RINGERS IV SOLN: INTRAVENOUS | Status: DC

## 2015-04-22 MED ORDER — OXYCODONE-ACETAMINOPHEN 5-325 MG PO TABS: 2.0000 | ORAL_TABLET | ORAL | Status: DC | PRN

## 2015-04-22 MED ORDER — OXYTOCIN BOLUS FROM INFUSION: 500.0000 mL | INTRAVENOUS | Status: DC

## 2015-04-22 MED ORDER — DIPHENHYDRAMINE HCL 50 MG/ML IJ SOLN: 12.5000 mg | INTRAMUSCULAR | Status: DC | PRN

## 2015-04-22 MED ORDER — PENICILLIN G POTASSIUM 5000000 UNITS IJ SOLR
5.0000 10*6.[IU] | Freq: Once | INTRAVENOUS | Status: AC
  Administered 2015-04-22: 5 10*6.[IU] via INTRAVENOUS
  Filled 2015-04-22: qty 5

## 2015-04-22 MED ORDER — EPHEDRINE 5 MG/ML INJ: 10.0000 mg | INTRAVENOUS | Status: DC | PRN

## 2015-04-22 MED ORDER — ACETAMINOPHEN 325 MG PO TABS: 650.0000 mg | ORAL_TABLET | ORAL | Status: DC | PRN

## 2015-04-22 MED ORDER — ONDANSETRON HCL 4 MG/2ML IJ SOLN: 4.0000 mg | Freq: Four times a day (QID) | INTRAMUSCULAR | Status: DC | PRN

## 2015-04-22 MED ORDER — PHENYLEPHRINE 40 MCG/ML (10ML) SYRINGE FOR IV PUSH (FOR BLOOD PRESSURE SUPPORT): 80.0000 ug | PREFILLED_SYRINGE | INTRAVENOUS | Status: DC | PRN

## 2015-04-22 MED ORDER — CITRIC ACID-SODIUM CITRATE 334-500 MG/5ML PO SOLN: 30.0000 mL | ORAL | Status: DC | PRN

## 2015-04-22 MED ORDER — OXYCODONE-ACETAMINOPHEN 5-325 MG PO TABS: 1.0000 | ORAL_TABLET | ORAL | Status: DC | PRN

## 2015-04-22 MED ORDER — PENICILLIN G POTASSIUM 5000000 UNITS IJ SOLR
2.5000 10*6.[IU] | INTRAVENOUS | Status: DC
  Filled 2015-04-22 (×3): qty 2.5

## 2015-04-22 MED ORDER — LACTATED RINGERS IV SOLN: 500.0000 mL | INTRAVENOUS | Status: DC | PRN

## 2015-04-22 NOTE — H&P (Signed)
Melinda Evans is a 24 y.o. female presenting at 1138.3 with early labor?   No cervical change.  Ctx's irregular.  Tracing cat one,   False labor? Maternal Medical History:  Reason for admission: Contractions.   Contractions: Onset was 1-2 hours ago.   Frequency: irregular.   Perceived severity is mild.    Fetal activity: Perceived fetal activity is normal.    Prenatal complications: no prenatal complications Prenatal Complications - Diabetes: none.    OB History    Gravida Para Term Preterm AB TAB SAB Ectopic Multiple Living   2 1 1       1      Past Medical History  Diagnosis Date  . UTI (lower urinary tract infection)   . Bladder spasms   . Gall stones   . Headache(784.0)   . GERD (gastroesophageal reflux disease)    Past Surgical History  Procedure Laterality Date  . Tonsillectomy    . Wisdom tooth extraction     Family History: family history includes Diabetes in her maternal grandmother and mother; Heart disease in her maternal grandfather; Hypertension in her maternal grandmother and paternal aunt. Social History:  reports that she has never smoked. She has never used smokeless tobacco. She reports that she does not drink alcohol or use illicit drugs.   Prenatal Transfer Tool  Maternal Diabetes: No Genetic Screening: Normal Maternal Ultrasounds/Referrals: Normal Fetal Ultrasounds or other Referrals:  None Maternal Substance Abuse:  No Significant Maternal Medications:  None Significant Maternal Lab Results:  None Other Comments:  None  ROS  Dilation: 2 Effacement (%): Thick Station: Ballotable Exam by:: dr Arelia Sneddonmccomb Blood pressure 117/74, pulse 93, temperature 97.3 F (36.3 C), temperature source Oral, resp. rate 18, height 5\' 2"  (1.575 m), weight 72.122 kg (159 lb), unknown if currently breastfeeding. Maternal Exam:  Uterine Assessment: Contraction strength is mild.  Contraction frequency is irregular.   Abdomen: Fundal height is c/ dates.   Fetal  presentation: vertex  Pelvis: adequate for delivery.   Cervix: 2 cm long and postior  Fetal Exam Fetal State Assessment: Category I - tracings are normal.     Physical Exam  Prenatal labs: ABO, Rh: --/--/O POS, O POS (05/19 0415) Antibody: NEG (05/19 0415) Rubella: Immune (11/30 0000) RPR: Nonreactive (11/30 0000)  HBsAg: Negative (11/30 0000)  HIV: Non-reactive (11/30 0000)  GBS: Positive (05/09 0000)   Assessment/Plan:IUP AT 38.3 POSSIBLE FALSE LABOR WILL WALK AND RECHECK   Mayes Sangiovanni S 04/22/2015, 8:49 AM

## 2015-04-22 NOTE — MAU Note (Signed)
Pt admitted last night in labor and states that during the morning contractons began to space out and Dr. Arelia SneddonMcComb sent her home d/t no change in cervix.

## 2015-04-22 NOTE — Discharge Instructions (Signed)
Braxton Hicks Contractions °Contractions of the uterus can occur throughout pregnancy. Contractions are not always a sign that you are in labor.  °WHAT ARE BRAXTON HICKS CONTRACTIONS?  °Contractions that occur before labor are called Braxton Hicks contractions, or false labor. Toward the end of pregnancy (32-34 weeks), these contractions can develop more often and may become more forceful. This is not true labor because these contractions do not result in opening (dilatation) and thinning of the cervix. They are sometimes difficult to tell apart from true labor because these contractions can be forceful and people have different pain tolerances. You should not feel embarrassed if you go to the hospital with false labor. Sometimes, the only way to tell if you are in true labor is for your health care provider to look for changes in the cervix. °If there are no prenatal problems or other health problems associated with the pregnancy, it is completely safe to be sent home with false labor and await the onset of true labor. °HOW CAN YOU TELL THE DIFFERENCE BETWEEN TRUE AND FALSE LABOR? °False Labor °· The contractions of false labor are usually shorter and not as hard as those of true labor.   °· The contractions are usually irregular.   °· The contractions are often felt in the front of the lower abdomen and in the groin.   °· The contractions may go away when you walk around or change positions while lying down.   °· The contractions get weaker and are shorter lasting as time goes on.   °· The contractions do not usually become progressively stronger, regular, and closer together as with true labor.   °True Labor °· Contractions in true labor last 30-70 seconds, become very regular, usually become more intense, and increase in frequency.   °· The contractions do not go away with walking.   °· The discomfort is usually felt in the top of the uterus and spreads to the lower abdomen and low back.   °· True labor can be  determined by your health care provider with an exam. This will show that the cervix is dilating and getting thinner.   °WHAT TO REMEMBER °· Keep up with your usual exercises and follow other instructions given by your health care provider.   °· Take medicines as directed by your health care provider.   °· Keep your regular prenatal appointments.   °· Eat and drink lightly if you think you are going into labor.   °· If Braxton Hicks contractions are making you uncomfortable:   °¨ Change your position from lying down or resting to walking, or from walking to resting.   °¨ Sit and rest in a tub of warm water.   °¨ Drink 2-3 glasses of water. Dehydration may cause these contractions.   °¨ Do slow and deep breathing several times an hour.   °WHEN SHOULD I SEEK IMMEDIATE MEDICAL CARE? °Seek immediate medical care if: °· Your contractions become stronger, more regular, and closer together.   °· You have fluid leaking or gushing from your vagina.   °· You have a fever.   °· You pass blood-tinged mucus.   °· You have vaginal bleeding.   °· You have continuous abdominal pain.   °· You have low back pain that you never had before.   °· You feel your baby's head pushing down and causing pelvic pressure.   °· Your baby is not moving as much as it used to.   °Document Released: 11/20/2005 Document Revised: 11/25/2013 Document Reviewed: 09/01/2013 °[employer]® Patient Information ©2015 [employer]. This information is not intended to replace advice given to you by your health care   provider. Make sure you discuss any questions you have with your health care provider. ° °

## 2015-04-22 NOTE — MAU Note (Signed)
Pt states that she began to have regular contractions at 0000. Pt denies LOF and states that baby has been active.

## 2015-04-22 NOTE — Discharge Summary (Signed)
Obstetric Discharge Summary Reason for Admission: observation/evaluation Prenatal Procedures: none Intrapartum Procedures: false laboe Postpartum Procedures: none Complications-Operative and Postpartum: none HEMOGLOBIN  Date Value Ref Range Status  04/22/2015 11.9* 12.0 - 15.0 g/dL Final   HCT  Date Value Ref Range Status  04/22/2015 34.0* 36.0 - 46.0 % Final    Physical Exam:  General: alert Lochia: appropriate Uterine Fundus: gravid uterus Incision: na DVT Evaluation: No evidence of DVT seen on physical exam.  Discharge Diagnoses: False labor-undelivered  Discharge Information: Date: 04/22/2015 Activity: pelvic rest Diet: routine Medications: PNV Condition: stable Instructions: refer to practice specific booklet and na Discharge to: home   Newborn Data: Live born unspecified sex  Birth Weight:   APGAR: ,   Home with undelivered.  Shoshana Johal S 04/22/2015, 9:13 AM

## 2015-04-22 NOTE — MAU Note (Signed)
Pt to be admitted with routine orders and GBS protocol orders.

## 2015-04-22 NOTE — Progress Notes (Signed)
Patient ID: Melinda Evans, female   DOB: 09/21/1991, 24 y.o.   MRN: [id number] NOT UNCOMFORTABLE MONITOR NO CTX''S FHR CAT ONE CERVIX 2 CM THICK AND POSTIOR BOWI PROBABLE FALSE LABOR WILL WALK AND RECHECK

## 2015-04-22 NOTE — MAU Note (Signed)
PT SAYS SHE LEFT HERE  AT 10AM-  WAS ADMITTED TO L/D -  THEN DISCHARGED    - SAID SHE WAS  3 CM-    DR   MCCOMB  .  DENIES  AND MRSA.. AND HSV .  GBS- POSITIVE

## 2015-04-23 ENCOUNTER — Inpatient Hospital Stay (HOSPITAL_COMMUNITY): Payer: 59

## 2015-04-23 DIAGNOSIS — O368390 Maternal care for abnormalities of the fetal heart rate or rhythm, unspecified trimester, not applicable or unspecified: Secondary | ICD-10-CM | POA: Insufficient documentation

## 2015-04-23 DIAGNOSIS — Z3A38 38 weeks gestation of pregnancy: Secondary | ICD-10-CM | POA: Insufficient documentation

## 2015-04-23 NOTE — MAU Note (Signed)
Dr. Senaida OresMcCoomb would like BPP on patient d/t variable.

## 2015-04-23 NOTE — Discharge Instructions (Signed)
Braxton Hicks Contractions °Contractions of the uterus can occur throughout pregnancy. Contractions are not always a sign that you are in labor.  °WHAT ARE BRAXTON HICKS CONTRACTIONS?  °Contractions that occur before labor are called Braxton Hicks contractions, or false labor. Toward the end of pregnancy (32-34 weeks), these contractions can develop more often and may become more forceful. This is not true labor because these contractions do not result in opening (dilatation) and thinning of the cervix. They are sometimes difficult to tell apart from true labor because these contractions can be forceful and people have different pain tolerances. You should not feel embarrassed if you go to the hospital with false labor. Sometimes, the only way to tell if you are in true labor is for your health care provider to look for changes in the cervix. °If there are no prenatal problems or other health problems associated with the pregnancy, it is completely safe to be sent home with false labor and await the onset of true labor. °HOW CAN YOU TELL THE DIFFERENCE BETWEEN TRUE AND FALSE LABOR? °False Labor °· The contractions of false labor are usually shorter and not as hard as those of true labor.   °· The contractions are usually irregular.   °· The contractions are often felt in the front of the lower abdomen and in the groin.   °· The contractions may go away when you walk around or change positions while lying down.   °· The contractions get weaker and are shorter lasting as time goes on.   °· The contractions do not usually become progressively stronger, regular, and closer together as with true labor.   °True Labor °· Contractions in true labor last 30-70 seconds, become very regular, usually become more intense, and increase in frequency.   °· The contractions do not go away with walking.   °· The discomfort is usually felt in the top of the uterus and spreads to the lower abdomen and low back.   °· True labor can be  determined by your health care provider with an exam. This will show that the cervix is dilating and getting thinner.   °WHAT TO REMEMBER °· Keep up with your usual exercises and follow other instructions given by your health care provider.   °· Take medicines as directed by your health care provider.   °· Keep your regular prenatal appointments.   °· Eat and drink lightly if you think you are going into labor.   °· If Braxton Hicks contractions are making you uncomfortable:   °¨ Change your position from lying down or resting to walking, or from walking to resting.   °¨ Sit and rest in a tub of warm water.   °¨ Drink 2-3 glasses of water. Dehydration may cause these contractions.   °¨ Do slow and deep breathing several times an hour.   °WHEN SHOULD I SEEK IMMEDIATE MEDICAL CARE? °Seek immediate medical care if: °· Your contractions become stronger, more regular, and closer together.   °· You have fluid leaking or gushing from your vagina.   °· You have a fever.   °· You pass blood-tinged mucus.   °· You have vaginal bleeding.   °· You have continuous abdominal pain.   °· You have low back pain that you never had before.   °· You feel your baby's head pushing down and causing pelvic pressure.   °· Your baby is not moving as much as it used to.   °Document Released: 11/20/2005 Document Revised: 11/25/2013 Document Reviewed: 09/01/2013 °[employer]® Patient Information ©2015 [employer]. This information is not intended to replace advice given to you by your health care   provider. Make sure you discuss any questions you have with your health care provider. ° °

## 2015-04-25 ENCOUNTER — Encounter (HOSPITAL_COMMUNITY): Payer: Self-pay | Admitting: *Deleted

## 2015-04-25 ENCOUNTER — Inpatient Hospital Stay (HOSPITAL_COMMUNITY): Payer: 59 | Admitting: Anesthesiology

## 2015-04-25 ENCOUNTER — Inpatient Hospital Stay (HOSPITAL_COMMUNITY)
Admission: AD | Admit: 2015-04-25 | Discharge: 2015-04-27 | DRG: 775 | Disposition: A | Discharge status: Home / Self Care | Payer: 59 | Source: Ambulatory Visit | Attending: Obstetrics and Gynecology | Admitting: Obstetrics and Gynecology

## 2015-04-25 DIAGNOSIS — Z3A38 38 weeks gestation of pregnancy: Secondary | ICD-10-CM | POA: Diagnosis present

## 2015-04-25 DIAGNOSIS — O9962 Diseases of the digestive system complicating childbirth: Secondary | ICD-10-CM | POA: Diagnosis present

## 2015-04-25 DIAGNOSIS — K219 Gastro-esophageal reflux disease without esophagitis: Secondary | ICD-10-CM | POA: Diagnosis present

## 2015-04-25 LAB — CBC
HCT: 33.6 % — ABNORMAL LOW (ref 36.0–46.0)
HEMOGLOBIN: 11.6 g/dL — AB (ref 12.0–15.0)
MCH: 32.1 pg (ref 26.0–34.0)
MCHC: 34.5 g/dL (ref 30.0–36.0)
MCV: 93.1 fL (ref 78.0–100.0)
PLATELETS: 150 10*3/uL (ref 150–400)
RBC: 3.61 MIL/uL — ABNORMAL LOW (ref 3.87–5.11)
RDW: 14 % (ref 11.5–15.5)
WBC: 8.2 10*3/uL (ref 4.0–10.5)

## 2015-04-25 LAB — RPR: RPR Ser Ql: NONREACTIVE

## 2015-04-25 LAB — TYPE AND SCREEN
ABO/RH(D): O POS
ANTIBODY SCREEN: NEGATIVE

## 2015-04-25 MED ORDER — ACETAMINOPHEN 325 MG PO TABS: 650.0000 mg | ORAL_TABLET | ORAL | Status: DC | PRN

## 2015-04-25 MED ORDER — TETANUS-DIPHTH-ACELL PERTUSSIS 5-2.5-18.5 LF-MCG/0.5 IM SUSP: 0.5000 mL | Freq: Once | INTRAMUSCULAR | Status: DC

## 2015-04-25 MED ORDER — LIDOCAINE-EPINEPHRINE (PF) 2 %-1:200000 IJ SOLN
INTRAMUSCULAR | Status: DC | PRN
Start: 2015-04-25 — End: 2015-04-25
  Administered 2015-04-25: 3 mL

## 2015-04-25 MED ORDER — SIMETHICONE 80 MG PO CHEW: 80.0000 mg | CHEWABLE_TABLET | ORAL | Status: DC | PRN

## 2015-04-25 MED ORDER — OXYCODONE-ACETAMINOPHEN 5-325 MG PO TABS: 1.0000 | ORAL_TABLET | ORAL | Status: DC | PRN

## 2015-04-25 MED ORDER — BISACODYL 10 MG RE SUPP: 10.0000 mg | Freq: Every day | RECTAL | Status: DC | PRN

## 2015-04-25 MED ORDER — DEXTROSE 5 % IV SOLN
2.5000 10*6.[IU] | INTRAVENOUS | Status: DC
  Filled 2015-04-25 (×3): qty 2.5

## 2015-04-25 MED ORDER — OXYCODONE-ACETAMINOPHEN 5-325 MG PO TABS: 2.0000 | ORAL_TABLET | ORAL | Status: DC | PRN

## 2015-04-25 MED ORDER — DIPHENHYDRAMINE HCL 50 MG/ML IJ SOLN: 12.5000 mg | INTRAMUSCULAR | Status: DC | PRN

## 2015-04-25 MED ORDER — ZOLPIDEM TARTRATE 5 MG PO TABS: 5.0000 mg | ORAL_TABLET | Freq: Every evening | ORAL | Status: DC | PRN

## 2015-04-25 MED ORDER — DIBUCAINE 1 % RE OINT
1.0000 "application " | TOPICAL_OINTMENT | RECTAL | Status: DC | PRN
  Filled 2015-04-25: qty 28

## 2015-04-25 MED ORDER — FENTANYL 2.5 MCG/ML BUPIVACAINE 1/10 % EPIDURAL INFUSION (WH - ANES)
14.0000 mL/h | INTRAMUSCULAR | Status: DC | PRN
  Administered 2015-04-25: 14 mL/h via EPIDURAL
  Filled 2015-04-25: qty 125

## 2015-04-25 MED ORDER — LACTATED RINGERS IV SOLN: 500.0000 mL | INTRAVENOUS | Status: DC | PRN

## 2015-04-25 MED ORDER — PRENATAL MULTIVITAMIN CH
1.0000 | ORAL_TABLET | Freq: Every day | ORAL | Status: DC
  Administered 2015-04-26 – 2015-04-27 (×2): 1 via ORAL
  Filled 2015-04-25 (×2): qty 1

## 2015-04-25 MED ORDER — BUPIVACAINE HCL (PF) 0.25 % IJ SOLN
INTRAMUSCULAR | Status: DC | PRN
  Administered 2015-04-25 (×2): 4 mL

## 2015-04-25 MED ORDER — WITCH HAZEL-GLYCERIN EX PADS: 1.0000 "application " | MEDICATED_PAD | CUTANEOUS | Status: DC | PRN

## 2015-04-25 MED ORDER — PRENATAL MULTIVITAMIN CH
1.0000 | ORAL_TABLET | Freq: Every day | ORAL | Status: DC
  Administered 2015-04-25: 1 via ORAL
  Filled 2015-04-25: qty 1

## 2015-04-25 MED ORDER — OXYTOCIN BOLUS FROM INFUSION
500.0000 mL | INTRAVENOUS | Status: DC
  Administered 2015-04-25: 500 mL via INTRAVENOUS

## 2015-04-25 MED ORDER — SENNOSIDES-DOCUSATE SODIUM 8.6-50 MG PO TABS
2.0000 | ORAL_TABLET | ORAL | Status: DC
Start: 2015-04-26 — End: 2015-04-25

## 2015-04-25 MED ORDER — OXYCODONE-ACETAMINOPHEN 5-325 MG PO TABS
1.0000 | ORAL_TABLET | ORAL | Status: DC | PRN
  Administered 2015-04-25 – 2015-04-27 (×7): 1 via ORAL
  Filled 2015-04-25 (×10): qty 1

## 2015-04-25 MED ORDER — BENZOCAINE-MENTHOL 20-0.5 % EX AERO
1.0000 "application " | INHALATION_SPRAY | CUTANEOUS | Status: DC | PRN
  Administered 2015-04-25: 1 via TOPICAL
  Filled 2015-04-25: qty 56

## 2015-04-25 MED ORDER — ONDANSETRON HCL 4 MG PO TABS: 4.0000 mg | ORAL_TABLET | ORAL | Status: DC | PRN

## 2015-04-25 MED ORDER — EPHEDRINE 5 MG/ML INJ
10.0000 mg | INTRAVENOUS | Status: DC | PRN
  Filled 2015-04-25: qty 2

## 2015-04-25 MED ORDER — OXYCODONE-ACETAMINOPHEN 5-325 MG PO TABS
1.0000 | ORAL_TABLET | ORAL | Status: DC | PRN
  Administered 2015-04-25: 1 via ORAL
  Filled 2015-04-25: qty 1

## 2015-04-25 MED ORDER — PHENYLEPHRINE 40 MCG/ML (10ML) SYRINGE FOR IV PUSH (FOR BLOOD PRESSURE SUPPORT)
80.0000 ug | PREFILLED_SYRINGE | INTRAVENOUS | Status: DC | PRN
  Filled 2015-04-25: qty 2
  Filled 2015-04-25: qty 20

## 2015-04-25 MED ORDER — SODIUM CHLORIDE 0.9 % IJ SOLN: 3.0000 mL | INTRAMUSCULAR | Status: DC | PRN

## 2015-04-25 MED ORDER — PENICILLIN G POTASSIUM 5000000 UNITS IJ SOLR
5.0000 10*6.[IU] | Freq: Once | INTRAVENOUS | Status: AC
  Administered 2015-04-25: 5 10*6.[IU] via INTRAVENOUS
  Filled 2015-04-25: qty 5

## 2015-04-25 MED ORDER — WITCH HAZEL-GLYCERIN EX PADS
1.0000 "application " | MEDICATED_PAD | CUTANEOUS | Status: DC | PRN
  Administered 2015-04-25: 1 via TOPICAL

## 2015-04-25 MED ORDER — DIPHENHYDRAMINE HCL 25 MG PO CAPS: 25.0000 mg | ORAL_CAPSULE | Freq: Four times a day (QID) | ORAL | Status: DC | PRN

## 2015-04-25 MED ORDER — MEASLES, MUMPS & RUBELLA VAC ~~LOC~~ INJ
0.5000 mL | INJECTION | Freq: Once | SUBCUTANEOUS | Status: DC
  Filled 2015-04-25: qty 0.5

## 2015-04-25 MED ORDER — ONDANSETRON HCL 4 MG/2ML IJ SOLN: 4.0000 mg | INTRAMUSCULAR | Status: DC | PRN

## 2015-04-25 MED ORDER — OXYTOCIN 40 UNITS IN LACTATED RINGERS INFUSION - SIMPLE MED: 62.5000 mL/h | INTRAVENOUS | Status: DC | PRN

## 2015-04-25 MED ORDER — BENZOCAINE-MENTHOL 20-0.5 % EX AERO: 1.0000 "application " | INHALATION_SPRAY | CUTANEOUS | Status: DC | PRN

## 2015-04-25 MED ORDER — CITRIC ACID-SODIUM CITRATE 334-500 MG/5ML PO SOLN: 30.0000 mL | ORAL | Status: DC | PRN

## 2015-04-25 MED ORDER — IBUPROFEN 600 MG PO TABS
600.0000 mg | ORAL_TABLET | Freq: Four times a day (QID) | ORAL | Status: DC
  Administered 2015-04-25 – 2015-04-27 (×9): 600 mg via ORAL
  Filled 2015-04-25 (×8): qty 1

## 2015-04-25 MED ORDER — SODIUM CHLORIDE 0.9 % IJ SOLN: 3.0000 mL | Freq: Two times a day (BID) | INTRAMUSCULAR | Status: DC

## 2015-04-25 MED ORDER — IBUPROFEN 600 MG PO TABS
600.0000 mg | ORAL_TABLET | Freq: Four times a day (QID) | ORAL | Status: DC
Start: 2015-04-25 — End: 2015-04-25
  Filled 2015-04-25: qty 1

## 2015-04-25 MED ORDER — SODIUM CHLORIDE 0.9 % IV SOLN: 250.0000 mL | INTRAVENOUS | Status: DC | PRN

## 2015-04-25 MED ORDER — SENNOSIDES-DOCUSATE SODIUM 8.6-50 MG PO TABS
2.0000 | ORAL_TABLET | ORAL | Status: DC
  Administered 2015-04-26 – 2015-04-27 (×2): 2 via ORAL
  Filled 2015-04-25 (×2): qty 2

## 2015-04-25 MED ORDER — LANOLIN HYDROUS EX OINT: TOPICAL_OINTMENT | CUTANEOUS | Status: DC | PRN

## 2015-04-25 MED ORDER — LIDOCAINE HCL (PF) 1 % IJ SOLN
30.0000 mL | INTRAMUSCULAR | Status: DC | PRN
  Filled 2015-04-25: qty 30

## 2015-04-25 MED ORDER — DIBUCAINE 1 % RE OINT
1.0000 "application " | TOPICAL_OINTMENT | RECTAL | Status: DC | PRN
  Administered 2015-04-25: 1 via RECTAL

## 2015-04-25 MED ORDER — FLEET ENEMA 7-19 GM/118ML RE ENEM: 1.0000 | ENEMA | Freq: Every day | RECTAL | Status: DC | PRN

## 2015-04-25 MED ORDER — FLEET ENEMA 7-19 GM/118ML RE ENEM: 1.0000 | ENEMA | RECTAL | Status: DC | PRN

## 2015-04-25 MED ORDER — OXYTOCIN 40 UNITS IN LACTATED RINGERS INFUSION - SIMPLE MED
62.5000 mL/h | INTRAVENOUS | Status: DC
  Administered 2015-04-25: 62.5 mL/h via INTRAVENOUS
  Filled 2015-04-25: qty 1000

## 2015-04-25 MED ORDER — LACTATED RINGERS IV SOLN
INTRAVENOUS | Status: DC
  Administered 2015-04-25: 02:00:00 via INTRAVENOUS

## 2015-04-25 MED ORDER — ONDANSETRON HCL 4 MG/2ML IJ SOLN: 4.0000 mg | Freq: Four times a day (QID) | INTRAMUSCULAR | Status: DC | PRN

## 2015-04-25 NOTE — MAU Note (Signed)
Contractions since 2100. STronger last hour. 4cm last sve. Denies LOF . Bloody show

## 2015-04-25 NOTE — Progress Notes (Signed)
RN went into patients room to adjust US to obtain FHR at 0430. Talking with patient & discussing will plan to recheck patient at 0500, would be 1.5 hrs from previous SVE. Patient was not feeling any pressure or pain at this time. 0432 FHR obtained, but then having to adjust US within 1min to obtain FHR again. Room was dark, but RN noticed neonates head out of vagina. RN Careers information officerhit Staff Assist button. RN delivered baby. Was dried & stimulated as ususal & placed skin to skin with mom. Melinda Evans. Booker, CNM assisted with delivery of placenta. Dr Henderson Cloudomblin was called as delivery was occurring & is on his way to hospital.

## 2015-04-25 NOTE — H&P (Signed)
Melinda Evans is a 24 y.o. female presenting for UCs. Maternal Medical History:  Reason for admission: Contractions.     OB History    Gravida Para Term Preterm AB TAB SAB Ectopic Multiple Living   2 1 1       1      Past Medical History  Diagnosis Date  . UTI (lower urinary tract infection)   . Bladder spasms   . Gall stones   . Headache(784.0)   . GERD (gastroesophageal reflux disease)    Past Surgical History  Procedure Laterality Date  . Tonsillectomy    . Wisdom tooth extraction     Family History: family history includes Diabetes in her maternal grandmother and mother; Heart disease in her maternal grandfather; Hypertension in her maternal grandmother and paternal aunt. Social History:  reports that she has never smoked. She has never used smokeless tobacco. She reports that she does not drink alcohol or use illicit drugs.   Prenatal Transfer Tool  Maternal Diabetes: No Genetic Screening: Normal Maternal Ultrasounds/Referrals: Normal Fetal Ultrasounds or other Referrals:  None Maternal Substance Abuse:  No Significant Maternal Medications:  None Significant Maternal Lab Results:  None Other Comments:  None  ROS  Dilation: 5.5 Effacement (%): 80 Station: -1, -2 Exam by:: e. poore rn Blood pressure 124/70, pulse 95, temperature 97.5 F (36.4 C), temperature source Oral, resp. rate 18, height 5\' 2"  (1.575 m), weight 162 lb (73.483 kg), SpO2 98 %, unknown if currently breastfeeding. Exam Physical Exam  Cardiovascular: Normal rate.   Respiratory: Effort normal.    Prenatal labs: ABO, Rh: --/--/O POS (05/22 0145) Antibody: NEG (05/22 0145) Rubella: Immune (11/30 0000) RPR: Non Reactive (05/19 0415)  HBsAg: Negative (11/30 0000)  HIV: Non-reactive (11/30 0000)  GBS: Positive (05/09 0000)   Assessment/Plan: 24 yo G2P1 @ 38 6/7 weeks in active labor Patient admitted to L&D. Cx was 5 cm dilated. Shortly after baby delivered precipitously in bed. Placenta  delivered by CNM. I responded immediately from home when called after delivery.   Thresa Dozier II,Demarious Kapur E 04/25/2015, 5:04 AM

## 2015-04-25 NOTE — Anesthesia Preprocedure Evaluation (Signed)
Anesthesia Evaluation  Patient identified by MRN, date of birth, ID band Patient awake    Reviewed: Allergy & Precautions, NPO status , Patient's Chart, lab work & pertinent test results  History of Anesthesia Complications Negative for: history of anesthetic complications  Airway Mallampati: II  TM Distance: >3 FB Neck ROM: Full    Dental  (+) Teeth Intact   Pulmonary neg pulmonary ROS,  breath sounds clear to auscultation        Cardiovascular negative cardio ROS  Rhythm:Regular     Neuro/Psych  Headaches, negative psych ROS   GI/Hepatic negative GI ROS, Neg liver ROS,   Endo/Other  negative endocrine ROS  Renal/GU negative Renal ROS     Musculoskeletal   Abdominal   Peds  Hematology  (+) anemia ,   Anesthesia Other Findings   Reproductive/Obstetrics (+) Pregnancy                             Anesthesia Physical Anesthesia Plan  ASA: II  Anesthesia Plan: Epidural   Post-op Pain Management:    Induction:   Airway Management Planned:   Additional Equipment:   Intra-op Plan:   Post-operative Plan:   Informed Consent: I have reviewed the patients History and Physical, chart, labs and discussed the procedure including the risks, benefits and alternatives for the proposed anesthesia with the patient or authorized representative who has indicated his/her understanding and acceptance.     Plan Discussed with: Anesthesiologist  Anesthesia Plan Comments:         Anesthesia Quick Evaluation

## 2015-04-25 NOTE — Anesthesia Postprocedure Evaluation (Signed)
  Anesthesia Post-op Note  Patient: Melinda DuckSarah A Reed  Procedure(s) Performed: * No procedures listed *  Patient Location: Mother/Baby  Anesthesia Type:Epidural  Level of Consciousness: awake and alert   Airway and Oxygen Therapy: Patient Spontanous Breathing  Post-op Pain: mild  Post-op Assessment: Post-op Vital signs reviewed, Patient's Cardiovascular Status Stable, Respiratory Function Stable, No signs of Nausea or vomiting, Pain level controlled, No headache, No residual numbness and No residual motor weakness  Post-op Vital Signs: Reviewed  Last Vitals:  Filed Vitals:   04/25/15 1335  BP: 100/55  Pulse: 84  Temp: 37 C  Resp: 20    Complications: No apparent anesthesia complications

## 2015-04-25 NOTE — Lactation Note (Signed)
This note was copied from the chart of Melinda Evans. Lactation Consultation Note  Patient Name: Melinda Evans ZOXWR'UToday's Date: 04/25/2015 Reason for consult: Initial assessment Baby 12 hours of life. Mom states that she nursed first child over 5 months without any issues. Mom asked about how to feed baby. Enc mom to nurse with cues, but to offer lots of STS especially if baby has not cued in 2-3 hours. Offered to assist mom with latching, but mom declined saying that she believes baby latching fine and she is about to eat dinner. Mom given [facility] broch[facility], aware of OP/BFSG, community resources, and [facility] phone [facility] assistance after [facility]/C.   Maternal Data Has patient been taught Hand Expression?: Yes Does the patient have breastfeeding experience prior to this delivery?: Yes  Feeding Feeding Type: Breast Fed Length of feed: 25 min  LATCH Score/Interventions                      Lactation Tools Discussed/Used     Consult Status Consult Status: Follow-up Date: 04/26/15 Follow-up type: In-patient    Geralynn OchsWILLIARD, [name] 04/25/2015, 5:07 PM

## 2015-04-25 NOTE — Anesthesia Procedure Notes (Signed)
Epidural Patient location during procedure: OB  Staffing Anesthesiologist: Ghassan Coggeshall, CHRIS Performed by: anesthesiologist   Preanesthetic Checklist Completed: patient identified, surgical consent, pre-op evaluation, timeout performed, IV checked, risks and benefits discussed and monitors and equipment checked  Epidural Patient position: sitting Prep: site prepped and draped and DuraPrep Patient monitoring: heart rate, cardiac monitor, continuous pulse ox and blood pressure Approach: midline Location: L4-L5 Injection technique: LOR saline  Needle:  Needle type: Tuohy  Needle gauge: 17 G Needle length: 9 cm Needle insertion depth: 7 cm Catheter type: closed end flexible Catheter size: 19 Gauge Catheter at skin depth: 13 cm Test dose: negative and 2% lidocaine with Epi 1:200 K  Assessment Events: blood not aspirated, injection not painful, no injection resistance, negative IV test and no paresthesia  Additional Notes H+P and labs checked, risks and benefits discussed with the patient, consent obtained, procedure tolerated well and without complications.  Reason for block:procedure for pain   

## 2015-04-25 NOTE — Plan of Care (Signed)
Problem: Consults Goal: Birthing Suites Patient Information Press F2 to bring up selections list Outcome: Completed/Met Date Met:  04/25/15  Pt 37-[redacted] weeks EGA

## 2015-04-26 LAB — CBC
HCT: 30 % — ABNORMAL LOW (ref 36.0–46.0)
Hemoglobin: 10.4 g/dL — ABNORMAL LOW (ref 12.0–15.0)
MCH: 32.6 pg (ref 26.0–34.0)
MCHC: 34.7 g/dL (ref 30.0–36.0)
MCV: 94 fL (ref 78.0–100.0)
Platelets: 155 10*3/uL (ref 150–400)
RBC: 3.19 MIL/uL — AB (ref 3.87–5.11)
RDW: 14.2 % (ref 11.5–15.5)
WBC: 8.8 10*3/uL (ref 4.0–10.5)

## 2015-04-26 NOTE — Lactation Note (Signed)
This note was copied from the chart of Melinda Evans. Lactation Consultation Note Experienced BF mom BF her 3918 month old for 6 months until she got teeth and started biting. Mom hopes to be able to BF this baby longer. Denies any soreness or tenderness to nipples. States cluster feeding. Has had 3 emesis recently of her colostrum. Asked why, explained maybe baby was full, or had an air bubble. Keep up right for about 20-30 min. After feeding to help prevent emesis. Baby had 9 stools, 6 voids. Appears satisfied after feedings. Discussed I&O, engorgement, supply and demand. Reminded of LC out pt. Services if needed and support groups. Patient Name: Melinda Evans ZHYQM'VToday's Date: 04/26/2015 Reason for consult: Follow-up assessment   Maternal Data    Feeding Feeding Type: Breast Fed Length of feed: 15 min  LATCH Score/Interventions Latch: Grasps breast easily, tongue down, lips flanged, rhythmical sucking. Intervention(s): Adjust position;Assist with latch  Audible Swallowing: Spontaneous and intermittent Intervention(s): Skin to skin  Type of Nipple: Everted at rest and after stimulation  Comfort (Breast/Nipple): Soft / non-tender     Hold (Positioning): No assistance needed to correctly position infant at breast. Intervention(s): Breastfeeding basics reviewed;Support Pillows  LATCH Score: 10  Lactation Tools Discussed/Used Pump Review: Setup, frequency, and cleaning;Milk Storage Initiated by:: Peri JeffersonL. Inna Tisdell RN Date initiated:: 04/26/15   Consult Status Consult Status: Complete Date: 04/26/15    Charyl DancerCARVER, Melinda Evans 04/26/2015, 5:41 AM

## 2015-04-26 NOTE — Progress Notes (Signed)
Post Partum Day 1 Subjective: no complaints, up ad lib, voiding, tolerating PO and + flatus  Objective: Blood pressure 116/69, pulse 82, temperature 98.1 F (36.7 C), temperature source Oral, resp. rate 17, height 5\' 2"  (1.575 m), weight 162 lb (73.483 kg), SpO2 98 %, unknown if currently breastfeeding.  Physical Exam:  General: alert and cooperative Lochia: appropriate Uterine Fundus: firm Incision: healing well DVT Evaluation: No evidence of DVT seen on physical exam. Negative Homan's sign. No cords or calf tenderness. No significant calf/ankle edema.   Recent Labs  04/25/15 0145 04/26/15 0525  HGB 11.6* 10.4*  HCT 33.6* 30.0*    Assessment/Plan: Plan for discharge tomorrow   LOS: 1 day   Melinda Evans G 04/26/2015, 7:54 AM

## 2015-04-26 NOTE — Progress Notes (Signed)
UR chart review completed.  

## 2015-04-26 NOTE — Lactation Note (Signed)
This note was copied from the chart of Melinda Evans. Lactation Consultation Note  Patient Name: Melinda Evans OZHYQ'MToday's Date: 04/26/2015 Reason for consult: Follow-up assessment     With this mom of a term baby, now 2235 hours old. Baby was latch, strong suckles and visible swallows, mo comfortable . Mom denies having any lactation questions, reports breast feeding going well.  Mom knows to call for questions/concerns.    Maternal Data    Feeding Feeding Type: Breast Fed  LATCH Score/Interventions Latch: Grasps breast easily, tongue down, lips flanged, rhythmical sucking.  Audible Swallowing: A few with stimulation  Type of Nipple: Everted at rest and after stimulation  Comfort (Breast/Nipple): Soft / non-tender     Hold (Positioning): No assistance needed to correctly position infant at breast. Intervention(s): Breastfeeding basics reviewed;Support Pillows;Position options;Skin to skin  LATCH Score: 9  Lactation Tools Discussed/Used     Consult Status Consult Status: PRN Follow-up type: Call as needed    Alfred LevinsLee, Marivel Mcclarty Anne 04/26/2015, 3:36 PM

## 2015-04-27 MED ORDER — OXYCODONE-ACETAMINOPHEN 5-325 MG PO TABS: 1.0000 | ORAL_TABLET | ORAL | Status: DC | PRN

## 2015-04-27 MED ORDER — IBUPROFEN 600 MG PO TABS: 600.0000 mg | ORAL_TABLET | Freq: Four times a day (QID) | ORAL | Status: DC

## 2015-04-27 NOTE — Lactation Note (Signed)
This note was copied from the chart of Melinda Evans. Lactation Consultation Note  Baby recently bf for approx 20 min.  Asher MuirJamie RN viewed latched. LS9, Parents deny questions or soreness. Reviewed engorgement care and monitoring voids/stools. Mom encouraged to feed baby 8-12 times/24 hours and with feeding cues.    Patient Name: Melinda Evans OZHYQ'MToday's Date: 04/27/2015 Reason for consult: Follow-up assessment   Maternal Data    Feeding Feeding Type: Breast Fed Length of feed: 15 min  LATCH Score/Interventions Latch: Grasps breast easily, tongue down, lips flanged, rhythmical sucking.  Audible Swallowing: A few with stimulation  Type of Nipple: Everted at rest and after stimulation  Comfort (Breast/Nipple): Soft / non-tender     Hold (Positioning): No assistance needed to correctly position infant at breast.  LATCH Score: 9  Lactation Tools Discussed/Used     Consult Status Consult Status: Complete    Hardie PulleyBerkelhammer, Ruth Boschen 04/27/2015, 9:59 AM

## 2015-04-27 NOTE — Discharge Summary (Signed)
Obstetric Discharge Summary Reason for Admission: onset of labor Prenatal Procedures: ultrasound Intrapartum Procedures: spontaneous vaginal delivery Postpartum Procedures: none Complications-Operative and Postpartum: none HEMOGLOBIN  Date Value Ref Range Status  04/26/2015 10.4* 12.0 - 15.0 g/dL Final   HCT  Date Value Ref Range Status  04/26/2015 30.0* 36.0 - 46.0 % Final    Physical Exam:  General: alert and cooperative Lochia: appropriate Uterine Fundus: firm Incision: perineum intact DVT Evaluation: No evidence of DVT seen on physical exam. Negative Homan's sign. No cords or calf tenderness. No significant calf/ankle edema.  Discharge Diagnoses: Term Pregnancy-delivered  Discharge Information: Date: 04/27/2015 Activity: pelvic rest Diet: routine Medications: PNV, Ibuprofen and Percocet Condition: stable Instructions: refer to practice specific booklet Discharge to: home   Newborn Data:   Amadeo GarnetReed, PendingBaby [[id number]][[id number]]  Live born unspecified sex  Birth Weight:   APGARBlenda Nicely: ,    Reed, Boy Emma [[id number]][[id number]]  Live born female  Birth Weight: 6 lb 13.5 oz (3105 g) APGAR: 9, 9  Home with mother.  Burlin Mcnair G 04/27/2015, 8:02 AM

## 2015-06-10 ENCOUNTER — Other Ambulatory Visit: Payer: Self-pay | Admitting: Obstetrics and Gynecology

## 2015-06-11 LAB — CYTOLOGY - PAP

## 2016-11-22 ENCOUNTER — Emergency Department (HOSPITAL_COMMUNITY): Payer: 59

## 2016-11-22 ENCOUNTER — Emergency Department (HOSPITAL_COMMUNITY)
Admission: EM | Admit: 2016-11-22 | Discharge: 2016-11-22 | Disposition: A | Discharge status: Home / Self Care | Payer: 59 | Attending: Emergency Medicine | Admitting: Emergency Medicine

## 2016-11-22 ENCOUNTER — Encounter (HOSPITAL_COMMUNITY): Payer: Self-pay | Admitting: *Deleted

## 2016-11-22 DIAGNOSIS — R0789 Other chest pain: Secondary | ICD-10-CM | POA: Diagnosis not present

## 2016-11-22 DIAGNOSIS — R079 Chest pain, unspecified: Secondary | ICD-10-CM

## 2016-11-22 DIAGNOSIS — R0602 Shortness of breath: Secondary | ICD-10-CM | POA: Diagnosis not present

## 2016-11-22 LAB — BASIC METABOLIC PANEL
Anion gap: 6 (ref 5–15)
BUN: 11 mg/dL (ref 6–20)
CALCIUM: 9.9 mg/dL (ref 8.9–10.3)
CO2: 28 mmol/L (ref 22–32)
Chloride: 107 mmol/L (ref 101–111)
Creatinine, Ser: 0.7 mg/dL (ref 0.44–1.00)
GFR calc Af Amer: >60 mL/min (ref >60)
GLUCOSE: 102 mg/dL — AB (ref 65–99)
Potassium: 3.6 mmol/L (ref 3.5–5.1)
Sodium: 141 mmol/L (ref 135–145)

## 2016-11-22 LAB — HEPATIC FUNCTION PANEL
ALBUMIN: 4.6 g/dL (ref 3.5–5.0)
ALK PHOS: 66 U/L (ref 38–126)
ALT: 16 U/L (ref 14–54)
AST: 18 U/L (ref 15–41)
BILIRUBIN TOTAL: 0.6 mg/dL (ref 0.3–1.2)
Bilirubin, Direct: <0.1 mg/dL — ABNORMAL LOW (ref 0.1–0.5)
Total Protein: 7.3 g/dL (ref 6.5–8.1)

## 2016-11-22 LAB — CBC
HCT: 41 % (ref 36.0–46.0)
Hemoglobin: 14.6 g/dL (ref 12.0–15.0)
MCH: 32.6 pg (ref 26.0–34.0)
MCHC: 35.6 g/dL (ref 30.0–36.0)
MCV: 91.5 fL (ref 78.0–100.0)
Platelets: 229 10*3/uL (ref 150–400)
RBC: 4.48 MIL/uL (ref 3.87–5.11)
RDW: 12.2 % (ref 11.5–15.5)
WBC: 6.3 10*3/uL (ref 4.0–10.5)

## 2016-11-22 LAB — URINALYSIS, ROUTINE W REFLEX MICROSCOPIC
BILIRUBIN URINE: NEGATIVE
Glucose, UA: NEGATIVE mg/dL
HGB URINE DIPSTICK: NEGATIVE
Ketones, ur: NEGATIVE mg/dL
Leukocytes, UA: NEGATIVE
Nitrite: NEGATIVE
PH: 6 (ref 5.0–8.0)
Protein, ur: NEGATIVE mg/dL
SPECIFIC GRAVITY, URINE: 1.004 — AB (ref 1.005–1.030)

## 2016-11-22 LAB — DIFFERENTIAL
Basophils Absolute: 0 10*3/uL (ref 0.0–0.1)
Basophils Relative: 0 %
EOS ABS: 0.1 10*3/uL (ref 0.0–0.7)
EOS PCT: 2 %
Lymphocytes Relative: 34 %
Lymphs Abs: 1.8 10*3/uL (ref 0.7–4.0)
Monocytes Absolute: 0.3 10*3/uL (ref 0.1–1.0)
Monocytes Relative: 6 %
NEUTROS PCT: 58 %
Neutro Abs: 3.2 10*3/uL (ref 1.7–7.7)

## 2016-11-22 LAB — MONONUCLEOSIS SCREEN: Mono Screen: NEGATIVE

## 2016-11-22 LAB — I-STAT TROPONIN, ED: TROPONIN I, POC: 0 ng/mL (ref 0.00–0.08)

## 2016-11-22 LAB — PREGNANCY, URINE: Preg Test, Ur: NEGATIVE

## 2016-11-22 LAB — POC URINE PREG, ED: Preg Test, Ur: NEGATIVE

## 2016-11-22 NOTE — Discharge Instructions (Signed)
Recheck with PCP as needed.  Return to ER with any worsening symptoms.

## 2016-11-22 NOTE — ED Triage Notes (Signed)
PT lifted 15 pound bag of dog food, got sob, chest pain, and dizzy.  Pt states she moved a crockpot upstairs and it happened again.  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  Pt states she has a swollen lymph node and was given abx and states now it is back and bigger.  Pt reports she has been fatigued.

## 2016-11-22 NOTE — ED Provider Notes (Signed)
[facility]-EMERGENCY DEPT Provider Note   CSN: [id number] Arrival date & time: 11/22/16  1133  By signing my name below, I, Rosario AdieWilliam Andrew Hiatt, attest that this documentation has been prepared under the direction and in the presence of Rolland PorterMark Noeh Sparacino, MD. Electronically Signed: Rosario AdieWilliam Andrew Hiatt, ED Scribe. 11/22/16. 12:44 PM.  History   Chief Complaint Chief Complaint  Patient presents with  . Chest Pain  . Shortness of Breath   The history is provided by the patient. No language interpreter was used.    HPI Comments: Melinda Evans is a 25 y.o. female with a PMHx of GERD, who presents to the Emergency Department complaining of intermittent episodes of shortness of breath beginning this morning. 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 Her symptoms resolved on their own shortly following; however, she notes that she later this afternoon that she was lifting a heavy Croc-Pot when her symptoms returned and resolved again several minutes after. No episodes since. After this second episode, she states that her friend brought her to a fire department where they measured her BP and pulse, which was 140/110 and in the high 90s, respectively. Pt additionally reports that [facility]aden[facility]. At that time, she was prescribed antibiotics which did not relieve this, and she notes that she feels that this lymph node has since grown in size. She additionally states that she has been more fatigued since the onset of this issue, and had intermittent episodes of nocturnal hyperhidrosis over the past month. Pt currently is on Nexplanon implantation hormone therapy, and she notes that she has not had a regular menstrual period for the past two years. Pt is not currently followed by a PCP. She denies any significant  weight changes, chills, or any other associated symptoms.   Past Medical History:  Diagnosis Date  . Bladder spasms   . Gall stones   . GERD (gastroesophageal reflux disease)   . Headache(784.0)   . UTI (lower urinary tract infection)    Patient Active Problem List   Diagnosis Date Noted  . Normal labor 04/25/2015  . Variable fetal heart rate decelerations, antepartum   . [redacted] weeks gestation of pregnancy   . Indication for care in labor or delivery 04/22/2015   Past Surgical History:  Procedure Laterality Date  . TONSILLECTOMY    . WISDOM TOOTH EXTRACTION     OB History    Gravida Para Term Preterm AB Living   2 2 2     2    SAB TAB Ectopic Multiple Live Births         0 2     Home Medications    Prior to Admission medications   Medication Sig Start Date End Date Taking? Authorizing Provider  ibuprofen (ADVIL,MOTRIN) 600 MG tablet Take 1 tablet (600 mg total) by mouth every 6 (six) hours. 04/27/15   Julio Sicksarol Curtis, NP  oxyCODONE-acetaminophen (PERCOCET/ROXICET) 5-325 MG per tablet Take 1 tablet by mouth every 4 (four) hours as needed (for pain scale 4-7). 04/27/15   Julio Sicksarol Curtis, NP  Prenatal Vit-Fe Fumarate-FA (PRENATAL MULTIVITAMIN) TABS tablet Take 1 tablet by mouth daily at 12 noon.    Historical Provider, MD   Family History Family History  Problem Relation Age of Onset  . Diabetes Mother   . Hypertension Paternal Aunt   .  Diabetes Maternal Grandmother   . Hypertension Maternal Grandmother   . Heart disease Maternal Grandfather    Social History Social History  Substance Use Topics  . Smoking status: Never Smoker  . Smokeless tobacco: Never Used  . Alcohol use No   Allergies   Patient has no known allergies.  Review of Systems Review of Systems  Constitutional: Positive for diaphoresis (nocturnal hyperhidrosis) and fatigue. Negative for appetite change, chills, fever and unexpected weight change.  HENT: Negative for mouth sores, sore throat and trouble  swallowing.   Eyes: Negative for visual disturbance.  Respiratory: Positive for cough, chest tightness and shortness of breath. Negative for wheezing.   Cardiovascular: Negative for chest pain.  Gastrointestinal: Negative for abdominal distention, abdominal pain, diarrhea, nausea and vomiting.  Endocrine: Negative for polydipsia, polyphagia and polyuria.  Genitourinary: Negative for dysuria, frequency and hematuria.  Musculoskeletal: Negative for gait problem.  Skin: Negative for color change, pallor and rash.  Neurological: Positive for light-headedness. Negative for dizziness, syncope and headaches.  Hematological: Positive for adenopathy (left-sided cervical). Does not bruise/bleed easily.  Psychiatric/Behavioral: Negative for behavioral problems and confusion.  All other systems reviewed and are negative.  Physical Exam Updated Vital Signs BP 99/68 (BP Location: Right Arm)   Pulse 88   Temp 98.1 F (36.7 C) (Oral)   Resp 18   Ht 5\' 2"  (1.575 m)   Wt 160 lb (72.6 kg)   LMP 07/04/2014   SpO2 97%   BMI 29.26 kg/m   Physical Exam  Constitutional: She is oriented to person, place, and time. She appears well-developed and well-nourished. No distress.  HENT:  Head: Normocephalic.  Eyes: Conjunctivae are normal. Pupils are equal, round, and reactive to light. No scleral icterus.  Neck: Normal range of motion. Neck supple. No thyromegaly present.  Palpable, but non-enlarged lymph node on the left side of the neck.   Cardiovascular: Normal rate and regular rhythm.  Exam reveals no gallop and no friction rub.   No murmur heard. Pulmonary/Chest: Effort normal and breath sounds normal. No respiratory distress. She has no wheezes. She has no rales.  Abdominal: Soft. Bowel sounds are normal. She exhibits no distension. There is no tenderness. There is no rebound.  Musculoskeletal: Normal range of motion.  Neurological: She is alert and oriented to person, place, and time.  Skin: Skin is  warm and dry. No rash noted.  Psychiatric: She has a normal mood and affect. Her behavior is normal.   ED Treatments / Results  DIAGNOSTIC STUDIES: Oxygen Saturation is 100% on RA, normal by my interpretation.   COORDINATION OF CARE: 12:44 PM-Discussed next steps with pt. Pt verbalized understanding and is agreeable with the plan.   Labs (all labs ordered are listed, but only abnormal results are displayed) Labs Reviewed  BASIC METABOLIC PANEL - Abnormal; Notable for the following:       Result Value   Glucose, Bld 102 (*)    All other components within normal limits  URINALYSIS, ROUTINE W REFLEX MICROSCOPIC - Abnormal; Notable for the following:    Color, Urine COLORLESS (*)    Specific Gravity, Urine 1.004 (*)    All other components within normal limits  HEPATIC FUNCTION PANEL - Abnormal; Notable for the following:    Bilirubin, Direct <0.1 (*)    All other components within normal limits  CBC  PREGNANCY, URINE  DIFFERENTIAL  MONONUCLEOSIS SCREEN  TSH  I-STAT TROPOININ, ED  POC URINE PREG, ED    EKG  EKG  Interpretation  Date/Time:  Wednesday November 22 2016 12:01:07 EST Ventricular Rate:  78 PR Interval:  142 QRS Duration: 84 QT Interval:  362 QTC Calculation: 412 R Axis:   67 Text Interpretation:  Normal sinus rhythm Nonspecific T wave abnormality Abnormal ECG Confirmed by Fayrene Fearing  MD, Shloma Roggenkamp ([id number]) on 11/22/2016 12:50:33 PM Also confirmed by Fayrene Fearing  MD, Sadi Arave ([id number]), editor Valentina Lucks CT, Jola Babinski [phone])  on 11/22/2016 1:30:35 PM      Radiology Dg Chest 2 View  Result Date: 11/22/2016 CLINICAL DATA:  Chest pain EXAM: CHEST  2 VIEW COMPARISON:  None. FINDINGS: The heart size and mediastinal contours are within normal limits. Both lungs are clear. The visualized skeletal structures are unremarkable. IMPRESSION: Negative chest. Electronically Signed   By: Marnee Spring M.D.   On: 11/22/2016 12:33   Procedures Procedures   Medications Ordered in ED Medications -  No data to display  Initial Impression / Assessment and Plan / ED Course  I have reviewed the triage vital signs and the nursing notes.  Pertinent labs & imaging results that were available during my care of the patient were reviewed by me and considered in my medical decision making (see chart for details).  Clinical Course      Final Clinical Impressions(s) / ED Diagnoses   Final diagnoses:  Chest pain, unspecified type   New Prescriptions New Prescriptions   No medications on file   Pan-negative evaluation. Normal exam. Low risk patient. Plan discharge home. PCP follow-up. ER with acute changes.    Rolland Porter, MD 11/22/16 [phone]

## 2016-11-22 NOTE — ED Notes (Signed)
James MD back in for reassessment, family remains at bedside

## 2016-11-22 NOTE — ED Notes (Signed)
ED Provider at bedside. 

## 2018-10-11 IMAGING — CR DG CHEST 2V
2 series · 2 of 2 positions shown · non-contrast
Comparison: None.

CLINICAL DATA: Chest pain

EXAM:
CHEST  2 VIEW

[chest pa]
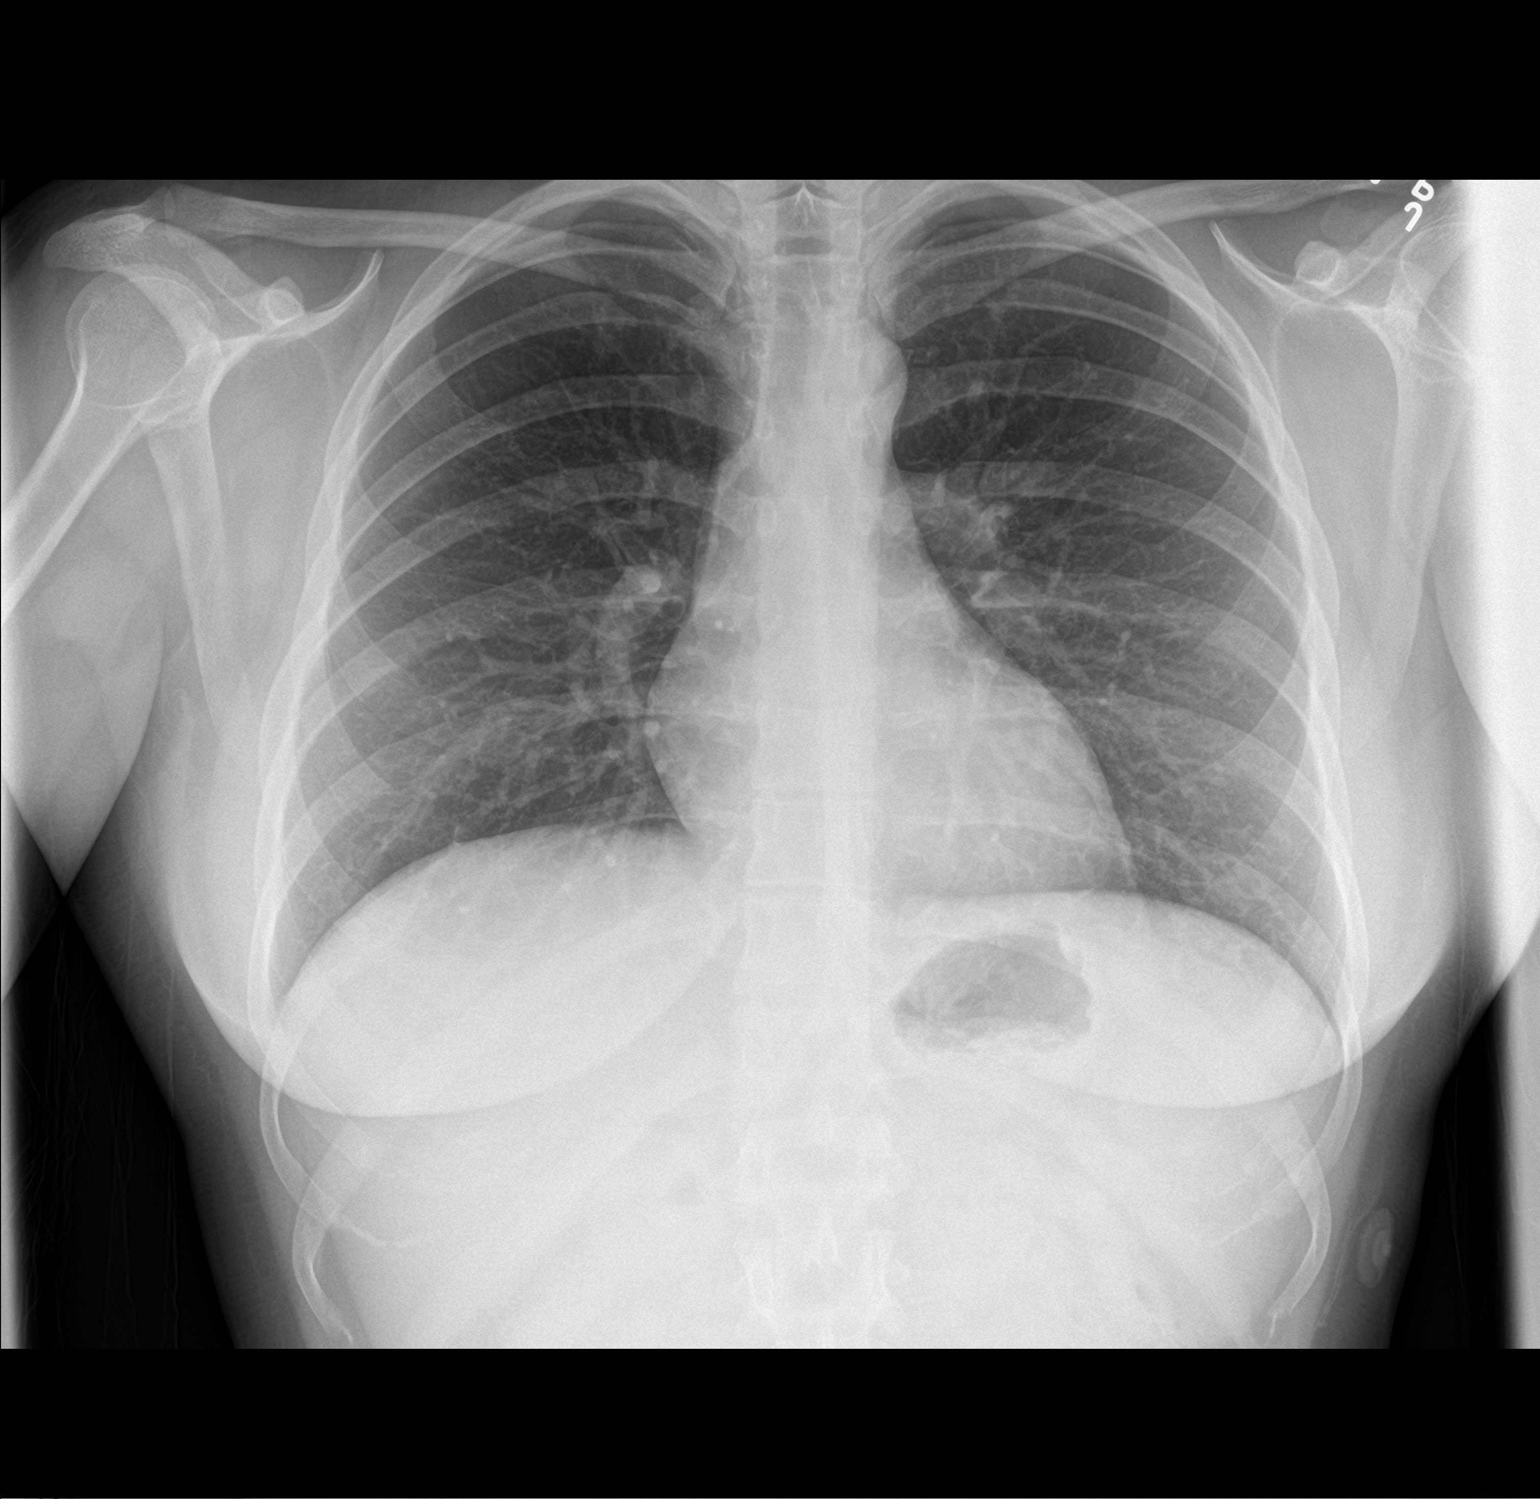

[chest lat]
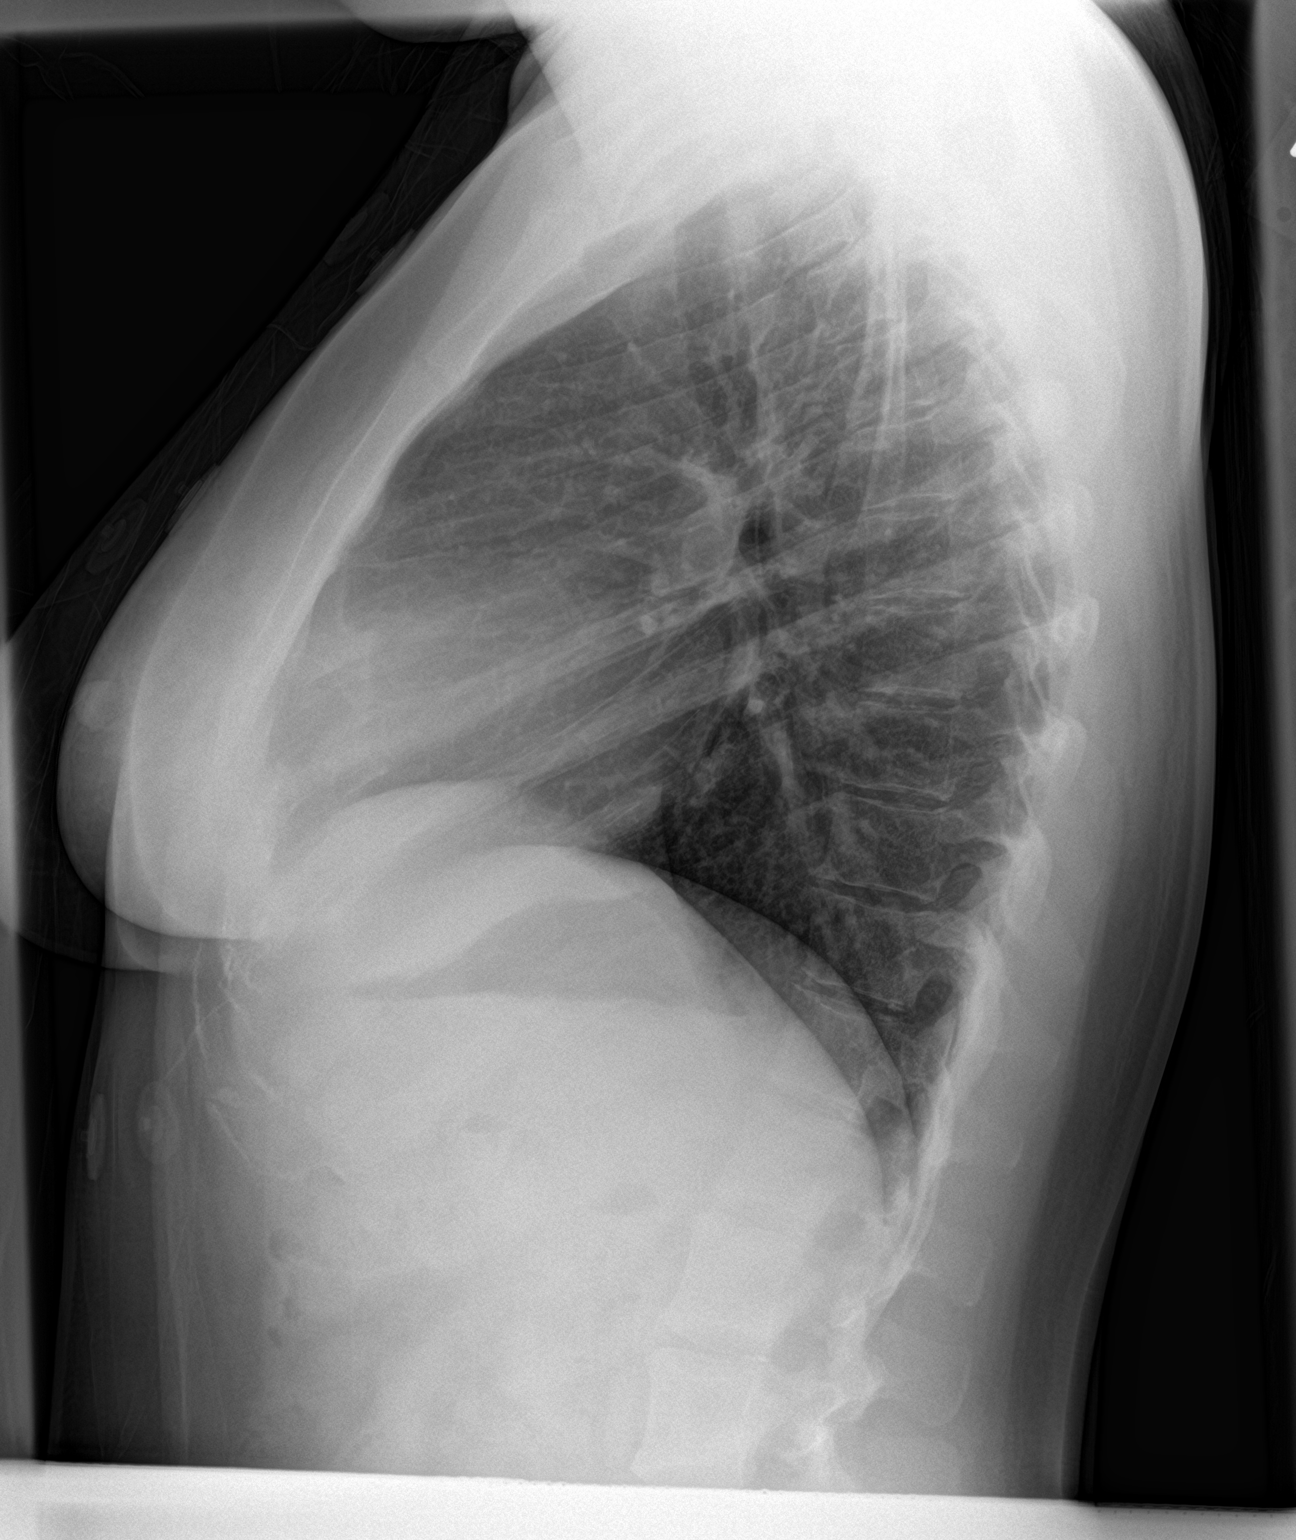

[2 of 2 positions shown; findings below may reference images not displayed]

FINDINGS: The heart size and mediastinal contours are within normal limits.
Both lungs are clear. The visualized skeletal structures are
unremarkable.
IMPRESSION: Negative chest.

## 2022-10-04 ENCOUNTER — Ambulatory Visit: Payer: Self-pay | Admitting: Physician Assistant

## 2022-11-15 ENCOUNTER — Ambulatory Visit: Payer: 59 | Admitting: Physician Assistant

## 2022-11-15 ENCOUNTER — Encounter: Payer: Self-pay | Admitting: Physician Assistant

## 2022-11-15 VITALS — BP 106/78 | HR 98 | Temp 97.4°F | Ht 62.0 in | Wt 153.2 lb

## 2022-11-15 DIAGNOSIS — Z23 Encounter for immunization: Secondary | ICD-10-CM | POA: Diagnosis not present

## 2022-11-15 DIAGNOSIS — F419 Anxiety disorder, unspecified: Secondary | ICD-10-CM | POA: Diagnosis not present

## 2022-11-15 DIAGNOSIS — R233 Spontaneous ecchymoses: Secondary | ICD-10-CM

## 2022-11-15 DIAGNOSIS — R5383 Other fatigue: Secondary | ICD-10-CM

## 2022-11-15 MED ORDER — ESCITALOPRAM OXALATE 10 MG PO TABS: 10.0000 mg | ORAL_TABLET | Freq: Every day | ORAL | 2 refills | Status: DC

## 2022-11-15 NOTE — Progress Notes (Signed)
New Patient Office Visit  Subjective:  Patient ID: Melinda Evans, female    DOB: [birth year]  Age: 31 y.o. MRN: [id number]  CC:  Chief Complaint  Patient presents with   Anxiety    HPI Melinda Evans presents for complaints of fatigue and anxiety. She states she has had anxiety since she was younger.  She has tried zoloft , cymbalta, and xanax in the past.  She states she still has a medication that was given to her to take as needed for panic attacks but unsure of the name.  She feels as though she would benefit from something taken daily.  She seems to overthink and feel overwhelmed.  Becomes fearful and overworries. Pt has had fatigue and feels chilled all time as well  Pt mentions she noted she has been bruising more easily than normal - no current bruising but did have moderate bruising on left leg recently without injury  Pt would like flu shot today   Past Medical History:  Diagnosis Date   Bladder spasms    Gall stones    GERD (gastroesophageal reflux disease)    Headache(784.0)    UTI (lower urinary tract infection)     Past Surgical History:  Procedure Laterality Date   TONSILLECTOMY     WISDOM TOOTH EXTRACTION      Family History  Problem Relation Age of Onset   Diabetes Mother    Hypertension Paternal Aunt    Diabetes Maternal Grandmother    Hypertension Maternal Grandmother    Heart disease Maternal Grandfather     Social History   Socioeconomic History   Marital status: Divorced    Spouse name: Not on file   Number of children: Not on file   Years of education: Not on file   Highest education level: Not on file  Occupational History   Not on file  Tobacco Use   Smoking status: Never   Smokeless tobacco: Never  Substance and Sexual Activity   Alcohol use: No   Drug use: No   Sexual activity: Yes    Birth control/protection: None  Other Topics Concern   Not on file  Social History Narrative   Not on file   Social Determinants of  Health   Financial Resource Strain: Not on file  Food Insecurity: Not on file  Transportation Needs: Not on file  Physical Activity: Not on file  Stress: Not on file  Social Connections: Not on file  Intimate Partner Violence: Not on file     Current Outpatient Medications:    escitalopram (LEXAPRO) 10 MG tablet, Take 1 tablet (10 mg total) by mouth daily., Disp: 30 tablet, Rfl: 2   etonogestrel (NEXPLANON) 68 MG IMPL implant, by Subdermal route., Disp: , Rfl:    ibuprofen (ADVIL,MOTRIN) 600 MG tablet, Take 1 tablet (600 mg total) by mouth every 6 (six) hours., Disp: 30 tablet, Rfl: 1   Melatonin 1 MG CAPS, Take by mouth., Disp: , Rfl:    No Known Allergies  ROS CONSTITUTIONAL: see HPI E/N/T: Negative for ear pain, nasal congestion and sore throat.  CARDIOVASCULAR: Negative for chest pain, dizziness, palpitations and pedal edema.  RESPIRATORY: Negative for recent cough and dyspnea.  INTEGUMENTARY: see HPI NEUROLOGICAL: Negative for dizziness and headaches.  PSYCHIATRIC: see HPI    Objective:  PHYSICAL EXAM:   VS: BP 106/78 (BP Location: Left Arm, Patient Position: Sitting, Cuff Size: Large)   Pulse 98   Temp (!) 97.4 F (36.3 C) (Temporal)  Ht 5\' 2"  (1.575 m)   Wt 153 lb 3.2 oz (69.5 kg)   SpO2 99%   BMI 28.02 kg/m   GEN: Well nourished, well developed, in no acute distress  Cardiac: RRR; no murmurs, rubs, or gallops,no edema - Respiratory:  normal respiratory rate and pattern with no distress - normal breath sounds with no rales, rhonchi, wheezes or rubs MS: no deformity or atrophy  Skin: warm and dry, no rash  Psych: euthymic mood, appropriate affect and demeanor   Health Maintenance Due  Topic Date Due   Hepatitis C Screening  Never done   PAP-Cervical Cytology Screening  06/09/2018   PAP SMEAR-Modifier  06/09/2018    There are no preventive care reminders to display for this patient.  No results found for: "TSH" Lab Results  Component Value Date    WBC 6.3 11/22/2016   HGB 14.6 11/22/2016   HCT 41.0 11/22/2016   MCV 91.5 11/22/2016   PLT 229 11/22/2016   Lab Results  Component Value Date   NA 141 11/22/2016   K 3.6 11/22/2016   CO2 28 11/22/2016   GLUCOSE 102 (H) 11/22/2016   BUN 11 11/22/2016   CREATININE 0.70 11/22/2016   BILITOT 0.6 11/22/2016   ALKPHOS 66 11/22/2016   AST 18 11/22/2016   ALT 16 11/22/2016   PROT 7.3 11/22/2016   ALBUMIN 4.6 11/22/2016   CALCIUM 9.9 11/22/2016   ANIONGAP 6 11/22/2016   No results found for: "CHOL" No results found for: "HDL" No results found for: "LDLCALC" No results found for: "TRIG" No results found for: "CHOLHDL" No results found for: "HGBA1C"    Assessment & Plan:   Problem List Items Addressed This Visit   None Visit Diagnoses     Other fatigue    -  Primary   Relevant Orders   CBC with Differential/Platelet   Comprehensive metabolic panel   TSH   VITAMIN D 25 Hydroxy (Vit-D Deficiency, Fractures)   Anxiety       Relevant Medications   escitalopram (LEXAPRO) 10 MG tablet   Other Relevant Orders   TSH   Needs flu shot       Relevant Orders   Flu Vaccine QUAD 6+ mos PF IM (Fluarix Quad PF) (Completed)   Bruises easily       Relevant Orders   Protime-INR       Meds ordered this encounter  Medications   escitalopram (LEXAPRO) 10 MG tablet    Sig: Take 1 tablet (10 mg total) by mouth daily.    Dispense:  30 tablet    Refill:  2    Order Specific Question:   Supervising Provider    Answer:   11/24/2016    Follow-up: Return in about 4 weeks (around 12/13/2022) for follow up.    SARA R Draycen Leichter, PA-C

## 2022-11-16 ENCOUNTER — Other Ambulatory Visit: Payer: Self-pay | Admitting: Physician Assistant

## 2022-11-16 DIAGNOSIS — E559 Vitamin D deficiency, unspecified: Secondary | ICD-10-CM

## 2022-11-16 LAB — COMPREHENSIVE METABOLIC PANEL
ALT: 22 IU/L (ref 0–32)
AST: 16 IU/L (ref 0–40)
Albumin/Globulin Ratio: 2 (ref 1.2–2.2)
Albumin: 4.4 g/dL (ref 4.0–5.0)
Alkaline Phosphatase: 69 IU/L (ref 44–121)
BUN/Creatinine Ratio: 15 (ref 9–23)
BUN: 11 mg/dL (ref 6–20)
Bilirubin Total: 0.4 mg/dL (ref 0.0–1.2)
CO2: 23 mmol/L (ref 20–29)
Calcium: 9.4 mg/dL (ref 8.7–10.2)
Chloride: 102 mmol/L (ref 96–106)
Creatinine, Ser: 0.74 mg/dL (ref 0.57–1.00)
Globulin, Total: 2.2 g/dL (ref 1.5–4.5)
Glucose: 108 mg/dL — ABNORMAL HIGH (ref 70–99)
Potassium: 3.7 mmol/L (ref 3.5–5.2)
Sodium: 140 mmol/L (ref 134–144)
Total Protein: 6.6 g/dL (ref 6.0–8.5)
eGFR: 112 mL/min/{1.73_m2} (ref >59)

## 2022-11-16 LAB — CBC WITH DIFFERENTIAL/PLATELET
Basophils Absolute: 0 10*3/uL (ref 0.0–0.2)
Basos: 0 %
EOS (ABSOLUTE): 0.2 10*3/uL (ref 0.0–0.4)
Eos: 2 %
Hematocrit: 38.4 % (ref 34.0–46.6)
Hemoglobin: 13.7 g/dL (ref 11.1–15.9)
Immature Grans (Abs): 0 10*3/uL (ref 0.0–0.1)
Immature Granulocytes: 0 %
Lymphocytes Absolute: 1.8 10*3/uL (ref 0.7–3.1)
Lymphs: 27 %
MCH: 33.7 pg — ABNORMAL HIGH (ref 26.6–33.0)
MCHC: 35.7 g/dL (ref 31.5–35.7)
MCV: 94 fL (ref 79–97)
Monocytes Absolute: 0.5 10*3/uL (ref 0.1–0.9)
Monocytes: 8 %
Neutrophils Absolute: 4.3 10*3/uL (ref 1.4–7.0)
Neutrophils: 63 %
Platelets: 220 10*3/uL (ref 150–450)
RBC: 4.07 x10E6/uL (ref 3.77–5.28)
RDW: 11.6 % — ABNORMAL LOW (ref 11.7–15.4)
WBC: 6.8 10*3/uL (ref 3.4–10.8)

## 2022-11-16 LAB — TSH: TSH: 0.939 u[IU]/mL (ref 0.450–4.500)

## 2022-11-16 LAB — VITAMIN D 25 HYDROXY (VIT D DEFICIENCY, FRACTURES): Vit D, 25-Hydroxy: 26.3 ng/mL — ABNORMAL LOW (ref 30.0–100.0)

## 2022-11-16 MED ORDER — VITAMIN D (ERGOCALCIFEROL) 1.25 MG (50000 UNIT) PO CAPS: 50000.0000 [IU] | ORAL_CAPSULE | ORAL | 5 refills | Status: DC

## 2022-12-04 NOTE — L&D Delivery Note (Signed)
Obstetrical Delivery Note   Date of Delivery:   11/27/2023 Primary OB:   AOB Gestational Age/EDD: [redacted]w[redacted]d (Dated by LMP) Reason for Admission: Elective IOL Antepartum complications: elevated blood pressure   Delivered By:   Siri Cole, CNM   Delivery Type:   spontaneous vaginal delivery  Delivery Details:   Pt arrived 2/70[birth year]03, she received 1 dose of Cytotec, PCN was started for GBS prophylaxis. She received a labor epidural.AROM  for clear fluid at 1058.  Soon after AROM she reported feeling rectal pressures. She was found to be complete and plus 2 at 1124. Over the next few contractions she effortlessly breathed the baby out. SVB of viable female at 1125, infant birthed OA to ROA,  through a nuchal cord followed by easy shoulders. Infant placed on mother's abdomen, vigorous cry with stimulation. HR>100. Cord was doubly clamped and cut by dad. Placenta delivered with maternal effort. On inspection of the perineum an intact perineum was found. Infant and mother skin to skin, both stable.   Anesthesia:    epidural Intrapartum complications: None GBS:    Positive PCN x2 Laceration:    none Episiotomy:    none Rectal exam:   deferred Placenta:    Delivered and expressed via active management. Intact: yes. To pathology: no.  Delayed Cord Clamping: yes Estimated Blood Loss:   Baby:    Liveborn female, "Elinor Rose" APGARs 9/9, weight pending gm  Carie Caddy, CNM   [facility]  11/27/2023 11:59 AM

## 2022-12-14 ENCOUNTER — Ambulatory Visit: Payer: 59 | Admitting: Physician Assistant

## 2023-01-03 ENCOUNTER — Encounter: Payer: Self-pay | Admitting: Physician Assistant

## 2023-01-03 ENCOUNTER — Ambulatory Visit: Payer: 59 | Admitting: Physician Assistant

## 2023-01-03 VITALS — BP 104/60 | HR 88 | Temp 97.4°F | Resp 18 | Ht 62.0 in | Wt 157.0 lb

## 2023-01-03 DIAGNOSIS — F419 Anxiety disorder, unspecified: Secondary | ICD-10-CM

## 2023-01-03 DIAGNOSIS — R233 Spontaneous ecchymoses: Secondary | ICD-10-CM | POA: Diagnosis not present

## 2023-01-03 NOTE — Progress Notes (Signed)
Established Patient Office Visit  Subjective:  Patient ID: Melinda Evans, female    DOB: [birth year]  Age: 32 y.o. MRN: [id number]  CC:  Chief Complaint  Patient presents with   Anxiety    HPI Melinda Evans presents for follow up of anxiety - she states overall she is feeling much better on lexapro 10mg  qd - she states her symptoms have lessened and when she does have mild anxiety it is manageable  Pt is no longer having any trouble with bruising  Past Medical History:  Diagnosis Date   Bladder spasms    Gall stones    GERD (gastroesophageal reflux disease)    Headache(784.0)    UTI (lower urinary tract infection)     Past Surgical History:  Procedure Laterality Date   TONSILLECTOMY     WISDOM TOOTH EXTRACTION      Family History  Problem Relation Age of Onset   Diabetes Mother    Hypertension Paternal Aunt    Diabetes Maternal Grandmother    Hypertension Maternal Grandmother    Heart disease Maternal Grandfather     Social History   Socioeconomic History   Marital status: Divorced    Spouse name: Not on file   Number of children: Not on file   Years of education: Not on file   Highest education level: Not on file  Occupational History   Not on file  Tobacco Use   Smoking status: Never   Smokeless tobacco: Never  Substance and Sexual Activity   Alcohol use: No   Drug use: No   Sexual activity: Yes    Birth control/protection: None  Other Topics Concern   Not on file  Social History Narrative   Not on file   Social Determinants of Health   Financial Resource Strain: Not on file  Food Insecurity: Not on file  Transportation Needs: Not on file  Physical Activity: Not on file  Stress: Not on file  Social Connections: Not on file  Intimate Partner Violence: Not on file     Current Outpatient Medications:    escitalopram (LEXAPRO) 10 MG tablet, Take 1 tablet (10 mg total) by mouth daily., Disp: 30 tablet, Rfl: 2   etonogestrel  (NEXPLANON) 68 MG IMPL implant, by Subdermal route., Disp: , Rfl:    ibuprofen (ADVIL,MOTRIN) 600 MG tablet, Take 1 tablet (600 mg total) by mouth every 6 (six) hours., Disp: 30 tablet, Rfl: 1   Melatonin 1 MG CAPS, Take by mouth., Disp: , Rfl:    Vitamin D, Ergocalciferol, (DRISDOL) 1.25 MG (50000 UNIT) CAPS capsule, Take 1 capsule (50,000 Units total) by mouth every 7 (seven) days., Disp: 5 capsule, Rfl: 5   No Known Allergies  ROS CONSTITUTIONAL: Negative for chills, fatigue, fever, CARDIOVASCULAR: Negative for chest pain, dizziness, RESPIRATORY: Negative for recent cough and dyspnea.  PSYCHIATRIC: Negative for sleep disturbance and to question depression screen.  Negative for depression, negative for anhedonia.        Objective:    PHYSICAL EXAM:   VS: BP 104/60   Pulse 88   Temp (!) 97.4 F (36.3 C)   Resp 18   Ht 5\' 2"  (1.575 m)   Wt 157 lb (71.2 kg)   SpO2 97%   BMI 28.72 kg/m   GEN: Well nourished, well developed, in no acute distress  Cardiac: RRR; no murmurs,  Respiratory:  normal respiratory rate and pattern with no distress - normal breath sounds with no rales, rhonchi, wheezes or rubs  Psych: euthymic mood, appropriate affect and demeanor    01/03/2023    3:10 PM 11/15/2022    1:43 PM  Depression screen PHQ 2/9  Decreased Interest 0 0  Down, Depressed, Hopeless 0 0  PHQ - 2 Score 0 0  Altered sleeping 0   Tired, decreased energy 1   Change in appetite 1   Trouble concentrating 3   Moving slowly or fidgety/restless 0   Suicidal thoughts 0   PHQ-9 Score 5   Difficult doing work/chores Somewhat difficult        Health Maintenance Due  Topic Date Due   Hepatitis C Screening  Never done   PAP-Cervical Cytology Screening  06/09/2018   PAP SMEAR-Modifier  06/09/2018    There are no preventive care reminders to display for this patient.  Lab Results  Component Value Date   TSH 0.939 11/15/2022   Lab Results  Component Value Date   WBC 6.8  11/15/2022   HGB 13.7 11/15/2022   HCT 38.4 11/15/2022   MCV 94 11/15/2022   PLT 220 11/15/2022   Lab Results  Component Value Date   NA 140 11/15/2022   K 3.7 11/15/2022   CO2 23 11/15/2022   GLUCOSE 108 (H) 11/15/2022   BUN 11 11/15/2022   CREATININE 0.74 11/15/2022   BILITOT 0.4 11/15/2022   ALKPHOS 69 11/15/2022   AST 16 11/15/2022   ALT 22 11/15/2022   PROT 6.6 11/15/2022   ALBUMIN 4.4 11/15/2022   CALCIUM 9.4 11/15/2022   ANIONGAP 6 11/22/2016   EGFR 112 11/15/2022   No results found for: "CHOL" No results found for: "HDL" No results found for: "LDLCALC" No results found for: "TRIG" No results found for: "CHOLHDL" No results found for: "HGBA1C"    Assessment & Plan:   Problem List Items Addressed This Visit       Other   Bruises easily - Primary - resolved    Anxiety Continue lexapro as directed    No orders of the defined types were placed in this encounter.   Follow-up: Return in about 4 months (around 05/04/2023) for follow up.    SARA R Alix Lahmann, PA-C

## 2023-02-15 ENCOUNTER — Other Ambulatory Visit: Payer: Self-pay | Admitting: Physician Assistant

## 2023-02-15 DIAGNOSIS — F419 Anxiety disorder, unspecified: Secondary | ICD-10-CM

## 2023-03-30 ENCOUNTER — Encounter: Payer: Self-pay | Admitting: Physician Assistant

## 2023-03-30 ENCOUNTER — Ambulatory Visit: Payer: 59 | Admitting: Physician Assistant

## 2023-03-30 VITALS — BP 106/72 | HR 73 | Temp 97.1°F | Ht 62.0 in | Wt 166.8 lb

## 2023-03-30 DIAGNOSIS — N926 Irregular menstruation, unspecified: Secondary | ICD-10-CM | POA: Diagnosis not present

## 2023-03-30 DIAGNOSIS — F419 Anxiety disorder, unspecified: Secondary | ICD-10-CM

## 2023-03-30 DIAGNOSIS — Z3201 Encounter for pregnancy test, result positive: Secondary | ICD-10-CM | POA: Insufficient documentation

## 2023-03-30 LAB — POCT URINE PREGNANCY: Preg Test, Ur: POSITIVE — AB

## 2023-03-30 NOTE — Progress Notes (Signed)
Acute Office Visit  Subjective:    Patient ID: Melinda Evans, female    DOB: December 13, 1990, 32 y.o.   MRN: [id number]  Chief Complaint  Patient presents with   Amenorrhea    HPI: Patient is in today for complaints of missed period - LMP was 02/27/23 and was normal.  She is currently having no other symptoms - no abdominal pain, nausea She is taking prenatal vitamin with folic acid She has stopped her lexapro for 2 days but states she has a history of severe anxiety/depression - recommend to restart and discuss with GYN as well   Past Medical History:  Diagnosis Date   Bladder spasms    Gall stones    GERD (gastroesophageal reflux disease)    Headache(784.0)    UTI (lower urinary tract infection)     Past Surgical History:  Procedure Laterality Date   TONSILLECTOMY     WISDOM TOOTH EXTRACTION      Family History  Problem Relation Age of Onset   Diabetes Mother    Hypertension Paternal Aunt    Diabetes Maternal Grandmother    Hypertension Maternal Grandmother    Heart disease Maternal Grandfather     Social History   Socioeconomic History   Marital status: Married    Spouse name: Not on file   Number of children: Not on file   Years of education: Not on file   Highest education level: Not on file  Occupational History   Not on file  Tobacco Use   Smoking status: Never   Smokeless tobacco: Never  Substance and Sexual Activity   Alcohol use: No   Drug use: No   Sexual activity: Yes    Birth control/protection: None  Other Topics Concern   Not on file  Social History Narrative   Not on file   Social Determinants of Health   Financial Resource Strain: Not on file  Food Insecurity: Not on file  Transportation Needs: Not on file  Physical Activity: Not on file  Stress: Not on file  Social Connections: Not on file  Intimate Partner Violence: Not on file    Outpatient Medications Prior to Visit  Medication Sig Dispense Refill   escitalopram  (LEXAPRO) 10 MG tablet TAKE 1 TABLET(10 MG) BY MOUTH DAILY 30 tablet 2   Vitamin D, Ergocalciferol, (DRISDOL) 1.25 MG (50000 UNIT) CAPS capsule Take 1 capsule (50,000 Units total) by mouth every 7 (seven) days. 5 capsule 5   etonogestrel (NEXPLANON) 68 MG IMPL implant by Subdermal route.     Melatonin 1 MG CAPS Take by mouth.     No facility-administered medications prior to visit.    No Known Allergies  Review of Systems    CONSTITUTIONAL: Negative for chills, fatigue, fever,  CARDIOVASCULAR: Negative for chest pain,  RESPIRATORY: Negative for recent cough and dyspnea.  GASTROINTESTINAL: Negative for abdominal pain, acid reflux symptoms, constipation, diarrhea, nausea and vomiting.  GU-  negative  Objective:    PHYSICAL EXAM:   VS: BP 106/72 (BP Location: Left Arm, Patient Position: Sitting, Cuff Size: Normal)   Pulse 73   Temp (!) 97.1 F (36.2 C) (Temporal)   Ht 5\' 2"  (1.575 m)   Wt 166 lb 12.8 oz (75.7 kg)   SpO2 99%   BMI 30.51 kg/m   GEN: Well nourished, well developed, in no acute distress  Cardiac: RRR; no murmurs,  Respiratory:  normal respiratory rate and pattern with no distress - normal breath sounds with no rales,  rhonchi, wheezes or rubs GI: normal bowel sounds, no masses or tenderness  Office Visit on 03/30/2023  Component Date Value Ref Range Status   Preg Test, Ur 03/30/2023 Positive (A)  Negative Final     Health Maintenance Due  Topic Date Due   Hepatitis C Screening  Never done   PAP-Cervical Cytology Screening  06/09/2018   PAP SMEAR-Modifier  06/09/2018    There are no preventive care reminders to display for this patient.   Lab Results  Component Value Date   TSH 0.939 11/15/2022   Lab Results  Component Value Date   WBC 6.8 11/15/2022   HGB 13.7 11/15/2022   HCT 38.4 11/15/2022   MCV 94 11/15/2022   PLT 220 11/15/2022   Lab Results  Component Value Date   NA 140 11/15/2022   K 3.7 11/15/2022   CO2 23 11/15/2022   GLUCOSE  108 (H) 11/15/2022   BUN 11 11/15/2022   CREATININE 0.74 11/15/2022   BILITOT 0.4 11/15/2022   ALKPHOS 69 11/15/2022   AST 16 11/15/2022   ALT 22 11/15/2022   PROT 6.6 11/15/2022   ALBUMIN 4.4 11/15/2022   CALCIUM 9.4 11/15/2022   ANIONGAP 6 11/22/2016   EGFR 112 11/15/2022   No results found for: "CHOL" No results found for: "HDL" No results found for: "LDLCALC" No results found for: "TRIG" No results found for: "CHOLHDL" No results found for: "HGBA1C"     Assessment & Plan:  Missed period -     POCT urine pregnancy  Positive pregnancy test Pt will make appt with [facility]  Anxiety Continue lexapro   No orders of the defined types were placed in this encounter.   Orders Placed This Encounter  Procedures   POCT urine pregnancy     Follow-up: Return if symptoms worsen or fail to improve.  An After Visit Summary was printed and given to the patient.  Jettie Pagan [facility] [phone]

## 2023-04-20 ENCOUNTER — Ambulatory Visit (INDEPENDENT_AMBULATORY_CARE_PROVIDER_SITE_OTHER): Payer: 59

## 2023-04-20 VITALS — Wt 163.0 lb

## 2023-04-20 DIAGNOSIS — Z3483 Encounter for supervision of other normal pregnancy, third trimester: Secondary | ICD-10-CM | POA: Insufficient documentation

## 2023-04-20 DIAGNOSIS — Z369 Encounter for antenatal screening, unspecified: Secondary | ICD-10-CM

## 2023-04-20 DIAGNOSIS — Z131 Encounter for screening for diabetes mellitus: Secondary | ICD-10-CM

## 2023-04-20 DIAGNOSIS — Z3480 Encounter for supervision of other normal pregnancy, unspecified trimester: Secondary | ICD-10-CM | POA: Insufficient documentation

## 2023-04-20 NOTE — Progress Notes (Signed)
New OB Intake  I connected with  [facility] on 04/20/23 at  9:15 AM EDT by telephone and verified that I am speaking with the correct person using two identifiers. Nurse is located at [facility] and pt is located at home.  I explained I am completing New OB Intake today. We discussed her EDD of 12/04/2023 that is based on LMP of 02/27/2023. Pt is G3/P2002. I reviewed her allergies, medications, Medical/Surgical/OB history, and appropriate screenings. There are no cats in the home.  Based on history, this is a/an pregnancy uncomplicated .   Patient Active Problem List   Diagnosis Date Noted   Supervision of other normal pregnancy, antepartum 04/20/2023   Missed period 03/30/2023   Positive pregnancy test 03/30/2023   Bruises easily 01/03/2023   Anxiety 01/03/2023   Normal labor 04/25/2015   Variable fetal heart rate decelerations, antepartum    [redacted] weeks gestation of pregnancy    Indication for care in labor or delivery 04/22/2015    Concerns addressed today None  Delivery Plans:  Plans to deliver at [facility].  Anatomy US Explained first scheduled [place] will be 6/7th and an anatomy scan will be done at 20 weeks. Labs Discussed Avelina Laine genetic screening with patient. Patient desires genetic testing to be drawn with new OB labs. Discussed possible labs to be drawn at new OB appointment.  COVID Vaccine Patient has not had COVID vaccine.   Social Determinants of Health Food Insecurity: denies food insecurity.  Transportation: Patient denies transportation needs. Childcare: Discussed no children allowed at ultrasound appointments.   First visit review I reviewed new OB appt with pt. I explained she will have ob bloodwork and pap smear/pelvic exam if indicated. Explained pt will be seen by Tresea Mall, CNM at first visit; encounter routed to appropriate provider.   Loran Senters, [facility] 04/20/2023  9:40 AM

## 2023-04-20 NOTE — Patient Instructions (Signed)
First Trimester of Pregnancy  The first trimester of pregnancy starts on the first day of your last menstrual period until the end of week 12. This is also called months 1 through 3 of pregnancy. Body changes during your first trimester Your body goes through many changes during pregnancy. The changes usually return to normal after your baby is born. Physical changes You may gain or lose weight. Your breasts may grow larger and hurt. The area around your nipples may get darker. Dark spots or blotches may develop on your face. You may have changes in your hair. Health changes You may feel like you might vomit (nauseous), and you may vomit. You may have heartburn. You may have headaches. You may have trouble pooping (constipation). Your gums may bleed. Other changes You may get tired easily. You may pee (urinate) more often. Your menstrual periods will stop. You may not feel hungry. You may want to eat certain kinds of food. You may have changes in your emotions from day to day. You may have more dreams. Follow these instructions at home: Medicines Take over-the-counter and prescription medicines only as told by your doctor. Some medicines are not safe during pregnancy. Take a prenatal vitamin that contains at least 600 micrograms (mcg) of folic acid. Eating and drinking Eat healthy meals that include: Fresh fruits and vegetables. Whole grains. Good sources of protein, such as meat, eggs, or tofu. Low-fat dairy products. Avoid raw meat and unpasteurized juice, milk, and cheese. If you feel like you may vomit, or you vomit: Eat 4 or 5 small meals a day instead of 3 large meals. Try eating a few soda crackers. Drink liquids between meals instead of during meals. You may need to take these actions to prevent or treat trouble pooping: Drink enough fluids to keep your pee (urine) pale yellow. Eat foods that are high in fiber. These include beans, whole grains, and fresh fruits and  vegetables. Limit foods that are high in fat and sugar. These include fried or sweet foods. Activity Exercise only as told by your doctor. Most people can do their usual exercise routine during pregnancy. Stop exercising if you have cramps or pain in your lower belly (abdomen) or low back. Do not exercise if it is too hot or too humid, or if you are in a place of great height (high altitude). Avoid heavy lifting. If you choose to, you may have sex unless your doctor tells you not to. Relieving pain and discomfort Wear a good support bra if your breasts are sore. Rest with your legs raised (elevated) if you have leg cramps or low back pain. If you have bulging veins (varicose veins) in your legs: Wear support hose as told by your doctor. Raise your feet for 15 minutes, 3-4 times a day. Limit salt in your food. Safety Wear your seat belt at all times when you are in a car. Talk with your doctor if someone is hurting you or yelling at you. Talk with your doctor if you are feeling sad or have thoughts of hurting yourself. Lifestyle Do not use hot tubs, steam rooms, or saunas. Do not douche. Do not use tampons or scented sanitary pads. Do not use herbal medicines, illegal drugs, or medicines that are not approved by your doctor. Do not drink alcohol. Do not smoke or use any products that contain nicotine or tobacco. If you need help quitting, ask your doctor. Avoid cat litter boxes and soil that is used by cats. These carry  germs that can cause harm to the baby and can cause a loss of your baby by miscarriage or stillbirth. General instructions Keep all follow-up visits. This is important. Ask for help if you need counseling or if you need help with nutrition. Your doctor can give you advice or tell you where to go for help. Visit your dentist. At home, brush your teeth with a soft toothbrush. Floss gently. Write down your questions. Take them to your prenatal visits. Where to find more  information American Pregnancy Association: americanpregnancy.org [employer]: www.acog.org Office on Women's Health: MightyReward.co.nz Contact a doctor if: You are dizzy. You have a fever. You have mild cramps or pressure in your lower belly. You have a nagging pain in your belly area. You continue to feel like you may vomit, you vomit, or you have watery poop (diarrhea) for 24 hours or longer. You have a bad-smelling fluid coming from your vagina. You have pain when you pee. You are exposed to a disease that spreads from person to person, such as chickenpox, measles, Zika virus, HIV, or hepatitis. Get help right away if: You have spotting or bleeding from your vagina. You have very bad belly cramping or pain. You have shortness of breath or chest pain. You have any kind of injury, such as from a fall or a car crash. You have new or increased pain, swelling, or redness in an arm or leg. Summary The first trimester of pregnancy starts on the first day of your last menstrual period until the end of week 12 (months 1 through 3). Eat 4 or 5 small meals a day instead of 3 large meals. Do not smoke or use any products that contain nicotine or tobacco. If you need help quitting, ask your doctor. Keep all follow-up visits. This information is not intended to replace advice given to you by your health care provider. Make sure you discuss any questions you have with your health care provider. Document Revised: 04/28/2020 Document Reviewed: 03/04/2020 Elsevier Patient Education  2023 Elsevier Inc. Commonly Asked Questions During Pregnancy  Cats: A parasite can be excreted in cat feces.  To avoid exposure you need to have another person empty the little box.  If you must empty the litter box you will need to wear gloves.  Wash your hands after handling your cat.  This parasite can also be found in raw or undercooked meat so this should also be  avoided.  Colds, Sore Throats, Flu: Please check your medication sheet to see what you can take for symptoms.  If your symptoms are unrelieved by these medications please call the office.  Dental Work: Most any dental work Agricultural consultant recommends is permitted.  X-rays should only be taken during the first trimester if absolutely necessary.  Your abdomen should be shielded with a lead apron during all x-rays.  Please notify your provider prior to receiving any x-rays.  Novocaine is fine; gas is not recommended.  If your dentist requires a note from [place] prior to dental work please call the office and we will provide one for you.  Exercise: Exercise is an important part of staying healthy during your pregnancy.  You may continue most exercises you were accustomed to prior to pregnancy.  Later in your pregnancy you will most likely notice you have difficulty with activities requiring balance like riding a bicycle.  It is important that you listen to your body and avoid activities that put you at a higher  risk of falling.  Adequate rest and staying well hydrated are a must!  If you have questions about the safety of specific activities ask your provider.    Exposure to Children with illness: Try to avoid obvious exposure; report any symptoms to [place] when noted,  If you have chicken pos, red measles or mumps, you should be immune to these diseases.   Please do not take any vaccines while pregnant unless you have checked with your OB provider.  Fetal Movement: After 28 weeks we recommend you do "kick counts" twice daily.  Lie or sit down in a calm quiet environment and count your baby movements "kicks".  You should feel your baby at least 10 times per hour.  If you have not felt 10 kicks within the first hour get up, walk around and have something sweet to eat or drink then repeat for an additional hour.  If count remains less than 10 per hour notify your provider.  Fumigating: Follow your pest control agent's  advice as to how long to stay out of your home.  Ventilate the area well before re-entering.  Hemorrhoids:   Most over-the-counter preparations can be used during pregnancy.  Check your medication to see what is safe to use.  It is important to use a stool softener or fiber in your diet and to drink lots of liquids.  If hemorrhoids seem to be getting worse please call the office.   Hot Tubs:  Hot tubs Jacuzzis and saunas are not recommended while pregnant.  These increase your internal body temperature and should be avoided.  Intercourse:  Sexual intercourse is safe during pregnancy as long as you are comfortable, unless otherwise advised by your provider.  Spotting may occur after intercourse; report any bright red bleeding that is heavier than spotting.  Labor:  If you know that you are in labor, please go to the hospital.  If you are unsure, please call the office and let us help you decide what to do.  Lifting, straining, etc:  If your job requires heavy lifting or straining please check with your provider for any limitations.  Generally, you should not lift items heavier than that you can lift simply with your hands and arms (no back muscles)  Painting:  Paint fumes do not harm your pregnancy, but may make you ill and should be avoided if possible.  Latex or water based paints have less odor than oils.  Use adequate ventilation while painting.  Permanents & Hair Color:  Chemicals in hair dyes are not recommended as they cause increase hair dryness which can increase hair loss during pregnancy.  " Highlighting" and permanents are allowed.  Dye may be absorbed differently and permanents may not hold as well during pregnancy.  Sunbathing:  Use a sunscreen, as skin burns easily during pregnancy.  Drink plenty of fluids; avoid over heating.  Tanning Beds:  Because their possible side effects are still unknown, tanning beds are not recommended.  Ultrasound Scans:  Routine ultrasounds are performed  at approximately 20 weeks.  You will be able to see your baby's general anatomy an if you would like to know the gender this can usually be determined as well.  If it is questionable when you conceived you may also receive an ultrasound early in your pregnancy for dating purposes.  Otherwise ultrasound exams are not routinely performed unless there is a medical necessity.  Although you can request a scan we ask that you pay for it when  conducted because insurance does not cover " patient request" scans.  Work: If your pregnancy proceeds without complications you may work until your due date, unless your physician or employer advises otherwise.  Round Ligament Pain/Pelvic Discomfort:  Sharp, shooting pains not associated with bleeding are fairly common, usually occurring in the second trimester of pregnancy.  They tend to be worse when standing up or when you remain standing for long periods of time.  These are the result of pressure of certain pelvic ligaments called "round ligaments".  Rest, Tylenol and heat seem to be the most effective relief.  As the womb and fetus grow, they rise out of the pelvis and the discomfort improves.  Please notify the office if your pain seems different than that described.  It may represent a more serious condition.  Commonly Asked Questions During Pregnancy  Cats: A parasite can be excreted in cat feces.  To avoid exposure you need to have another person empty the little box.  If you must empty the litter box you will need to wear gloves.  Wash your hands after handling your cat.  This parasite can also be found in raw or undercooked meat so this should also be avoided.  Colds, Sore Throats, Flu: Please check your medication sheet to see what you can take for symptoms.  If your symptoms are unrelieved by these medications please call the office.  Dental Work: Most any dental work Agricultural consultant recommends is permitted.  X-rays should only be taken during the first  trimester if absolutely necessary.  Your abdomen should be shielded with a lead apron during all x-rays.  Please notify your provider prior to receiving any x-rays.  Novocaine is fine; gas is not recommended.  If your dentist requires a note from [place] prior to dental work please call the office and we will provide one for you.  Exercise: Exercise is an important part of staying healthy during your pregnancy.  You may continue most exercises you were accustomed to prior to pregnancy.  Later in your pregnancy you will most likely notice you have difficulty with activities requiring balance like riding a bicycle.  It is important that you listen to your body and avoid activities that put you at a higher risk of falling.  Adequate rest and staying well hydrated are a must!  If you have questions about the safety of specific activities ask your provider.    Exposure to Children with illness: Try to avoid obvious exposure; report any symptoms to [place] when noted,  If you have chicken pos, red measles or mumps, you should be immune to these diseases.   Please do not take any vaccines while pregnant unless you have checked with your OB provider.  Fetal Movement: After 28 weeks we recommend you do "kick counts" twice daily.  Lie or sit down in a calm quiet environment and count your baby movements "kicks".  You should feel your baby at least 10 times per hour.  If you have not felt 10 kicks within the first hour get up, walk around and have something sweet to eat or drink then repeat for an additional hour.  If count remains less than 10 per hour notify your provider.  Fumigating: Follow your pest control agent's advice as to how long to stay out of your home.  Ventilate the area well before re-entering.  Hemorrhoids:   Most over-the-counter preparations can be used during pregnancy.  Check your medication to see what is safe to  use.  It is important to use a stool softener or fiber in your diet and to drink lots of  liquids.  If hemorrhoids seem to be getting worse please call the office.   Hot Tubs:  Hot tubs Jacuzzis and saunas are not recommended while pregnant.  These increase your internal body temperature and should be avoided.  Intercourse:  Sexual intercourse is safe during pregnancy as long as you are comfortable, unless otherwise advised by your provider.  Spotting may occur after intercourse; report any bright red bleeding that is heavier than spotting.  Labor:  If you know that you are in labor, please go to the hospital.  If you are unsure, please call the office and let us help you decide what to do.  Lifting, straining, etc:  If your job requires heavy lifting or straining please check with your provider for any limitations.  Generally, you should not lift items heavier than that you can lift simply with your hands and arms (no back muscles)  Painting:  Paint fumes do not harm your pregnancy, but may make you ill and should be avoided if possible.  Latex or water based paints have less odor than oils.  Use adequate ventilation while painting.  Permanents & Hair Color:  Chemicals in hair dyes are not recommended as they cause increase hair dryness which can increase hair loss during pregnancy.  " Highlighting" and permanents are allowed.  Dye may be absorbed differently and permanents may not hold as well during pregnancy.  Sunbathing:  Use a sunscreen, as skin burns easily during pregnancy.  Drink plenty of fluids; avoid over heating.  Tanning Beds:  Because their possible side effects are still unknown, tanning beds are not recommended.  Ultrasound Scans:  Routine ultrasounds are performed at approximately 20 weeks.  You will be able to see your baby's general anatomy an if you would like to know the gender this can usually be determined as well.  If it is questionable when you conceived you may also receive an ultrasound early in your pregnancy for dating purposes.  Otherwise ultrasound exams  are not routinely performed unless there is a medical necessity.  Although you can request a scan we ask that you pay for it when conducted because insurance does not cover " patient request" scans.  Work: If your pregnancy proceeds without complications you may work until your due date, unless your physician or employer advises otherwise.  Round Ligament Pain/Pelvic Discomfort:  Sharp, shooting pains not associated with bleeding are fairly common, usually occurring in the second trimester of pregnancy.  They tend to be worse when standing up or when you remain standing for long periods of time.  These are the result of pressure of certain pelvic ligaments called "round ligaments".  Rest, Tylenol and heat seem to be the most effective relief.  As the womb and fetus grow, they rise out of the pelvis and the discomfort improves.  Please notify the office if your pain seems different than that described.  It may represent a more serious condition.

## 2023-05-09 ENCOUNTER — Other Ambulatory Visit: Payer: Self-pay

## 2023-05-09 DIAGNOSIS — Z3480 Encounter for supervision of other normal pregnancy, unspecified trimester: Secondary | ICD-10-CM

## 2023-05-09 DIAGNOSIS — Z369 Encounter for antenatal screening, unspecified: Secondary | ICD-10-CM

## 2023-05-11 ENCOUNTER — Other Ambulatory Visit (HOSPITAL_COMMUNITY)
Admission: RE | Admit: 2023-05-11 | Discharge: 2023-05-11 | Disposition: A | Discharge status: Home / Self Care | Payer: 59 | Source: Ambulatory Visit | Attending: Advanced Practice Midwife | Admitting: Advanced Practice Midwife

## 2023-05-11 ENCOUNTER — Ambulatory Visit (INDEPENDENT_AMBULATORY_CARE_PROVIDER_SITE_OTHER): Payer: 59

## 2023-05-11 ENCOUNTER — Other Ambulatory Visit: Payer: Self-pay | Admitting: Advanced Practice Midwife

## 2023-05-11 ENCOUNTER — Other Ambulatory Visit: Payer: 59

## 2023-05-11 DIAGNOSIS — Z369 Encounter for antenatal screening, unspecified: Secondary | ICD-10-CM | POA: Insufficient documentation

## 2023-05-11 DIAGNOSIS — Z3480 Encounter for supervision of other normal pregnancy, unspecified trimester: Secondary | ICD-10-CM | POA: Diagnosis present

## 2023-05-11 DIAGNOSIS — Z3687 Encounter for antenatal screening for uncertain dates: Secondary | ICD-10-CM

## 2023-05-11 DIAGNOSIS — Z3A10 10 weeks gestation of pregnancy: Secondary | ICD-10-CM | POA: Diagnosis not present

## 2023-05-11 DIAGNOSIS — Z131 Encounter for screening for diabetes mellitus: Secondary | ICD-10-CM

## 2023-05-12 LAB — URINALYSIS, ROUTINE W REFLEX MICROSCOPIC
Bilirubin, UA: NEGATIVE
Glucose, UA: NEGATIVE
Ketones, UA: NEGATIVE
Leukocytes,UA: NEGATIVE
Nitrite, UA: NEGATIVE
Protein,UA: NEGATIVE
RBC, UA: NEGATIVE
Specific Gravity, UA: 1.008 (ref 1.005–1.030)
Urobilinogen, Ur: 0.2 mg/dL (ref 0.2–1.0)
pH, UA: 7.5 (ref 5.0–7.5)

## 2023-05-12 LAB — CBC/D/PLT+RPR+RH+ABO+RUBIGG...
Antibody Screen: NEGATIVE
Basophils Absolute: 0 10*3/uL (ref 0.0–0.2)
Basos: 0 %
EOS (ABSOLUTE): 0.2 10*3/uL (ref 0.0–0.4)
Eos: 2 %
HCV Ab: NONREACTIVE
HIV Screen 4th Generation wRfx: NONREACTIVE
Hematocrit: 35.4 % (ref 34.0–46.6)
Hemoglobin: 12.6 g/dL (ref 11.1–15.9)
Hepatitis B Surface Ag: NEGATIVE
Immature Grans (Abs): 0.1 10*3/uL (ref 0.0–0.1)
Immature Granulocytes: 1 %
Lymphocytes Absolute: 1.3 10*3/uL (ref 0.7–3.1)
Lymphs: 18 %
MCH: 33.9 pg — ABNORMAL HIGH (ref 26.6–33.0)
MCHC: 35.6 g/dL (ref 31.5–35.7)
MCV: 95 fL (ref 79–97)
Monocytes Absolute: 0.4 10*3/uL (ref 0.1–0.9)
Monocytes: 5 %
Neutrophils Absolute: 5.1 10*3/uL (ref 1.4–7.0)
Neutrophils: 74 %
Platelets: 201 10*3/uL (ref 150–450)
RBC: 3.72 x10E6/uL — ABNORMAL LOW (ref 3.77–5.28)
RDW: 12.2 % (ref 11.7–15.4)
RPR Ser Ql: NONREACTIVE
Rh Factor: POSITIVE
Rubella Antibodies, IGG: 2.67 index (ref >0.99)
Varicella zoster IgG: 795 index (ref >165)
WBC: 6.9 10*3/uL (ref 3.4–10.8)

## 2023-05-12 LAB — HEMOGLOBIN A1C
Est. average glucose Bld gHb Est-mCnc: 85 mg/dL
Hgb A1c MFr Bld: 4.6 % — ABNORMAL LOW (ref 4.8–5.6)

## 2023-05-12 LAB — HCV INTERPRETATION

## 2023-05-13 LAB — CULTURE, OB URINE

## 2023-05-13 LAB — URINE CULTURE, OB REFLEX: Organism ID, Bacteria: NO GROWTH

## 2023-05-14 LAB — MONITOR DRUG PROFILE 14(MW)
Amphetamine Scrn, Ur: NEGATIVE ng/mL
BARBITURATE SCREEN URINE: NEGATIVE ng/mL
BENZODIAZEPINE SCREEN, URINE: NEGATIVE ng/mL
Buprenorphine, Urine: NEGATIVE ng/mL
CANNABINOIDS UR QL SCN: NEGATIVE ng/mL
Cocaine (Metab) Scrn, Ur: NEGATIVE ng/mL
Creatinine(Crt), U: 40.6 mg/dL (ref 20.0–300.0)
Fentanyl, Urine: NEGATIVE pg/mL
Meperidine Screen, Urine: NEGATIVE ng/mL
Methadone Screen, Urine: NEGATIVE ng/mL
OXYCODONE+OXYMORPHONE UR QL SCN: NEGATIVE ng/mL
Opiate Scrn, Ur: NEGATIVE ng/mL
Ph of Urine: 7.8 (ref 4.5–8.9)
Phencyclidine Qn, Ur: NEGATIVE ng/mL
Propoxyphene Scrn, Ur: NEGATIVE ng/mL
SPECIFIC GRAVITY: 1.005
Tramadol Screen, Urine: NEGATIVE ng/mL

## 2023-05-14 LAB — NICOTINE SCREEN, URINE: Cotinine Ql Scrn, Ur: NEGATIVE ng/mL

## 2023-05-14 LAB — URINE CYTOLOGY ANCILLARY ONLY
Chlamydia: NEGATIVE
Comment: NEGATIVE
Comment: NORMAL
Neisseria Gonorrhea: NEGATIVE

## 2023-05-17 ENCOUNTER — Telehealth: Payer: Self-pay

## 2023-05-17 NOTE — Telephone Encounter (Signed)
LM that I would send a mychart msg which I did.

## 2023-05-17 NOTE — Telephone Encounter (Signed)
TRIAGE VOICEMAIL: Patient states she is [redacted] weeks pregnant. The past couple days she has noticed a very heavy increase in her discharge and it has changed texture. Inquiring if this is normal. [phone]

## 2023-05-29 ENCOUNTER — Encounter: Payer: Self-pay | Admitting: Advanced Practice Midwife

## 2023-05-29 ENCOUNTER — Ambulatory Visit (INDEPENDENT_AMBULATORY_CARE_PROVIDER_SITE_OTHER): Payer: 59 | Admitting: Advanced Practice Midwife

## 2023-05-29 VITALS — BP 115/78 | HR 88 | Wt 175.3 lb

## 2023-05-29 DIAGNOSIS — Z3480 Encounter for supervision of other normal pregnancy, unspecified trimester: Secondary | ICD-10-CM

## 2023-05-29 DIAGNOSIS — Z3481 Encounter for supervision of other normal pregnancy, first trimester: Secondary | ICD-10-CM

## 2023-05-29 DIAGNOSIS — Z3A13 13 weeks gestation of pregnancy: Secondary | ICD-10-CM | POA: Diagnosis not present

## 2023-05-29 LAB — POCT URINALYSIS DIPSTICK
Bilirubin, UA: NEGATIVE
Blood, UA: NEGATIVE
Glucose, UA: NEGATIVE
Ketones, UA: NEGATIVE
Leukocytes, UA: NEGATIVE
Nitrite, UA: NEGATIVE
Protein, UA: NEGATIVE
Spec Grav, UA: 1.01 (ref 1.010–1.025)
Urobilinogen, UA: 0.2 E.U./dL
pH, UA: 6.5 (ref 5.0–8.0)

## 2023-05-29 NOTE — Progress Notes (Signed)
[facility] New Obstetric Patient H&P    Chief Complaint: "Desires prenatal care"   History of Present Illness: Patient is a 32 y.o. [id number] Not Hispanic or Latino female, presents with amenorrhea and positive home pregnancy test. Patient's last menstrual period was 02/27/2023 (exact date). and based on her  LMP, her EDD is Estimated Date of Delivery: 12/04/23 and her EGA is [redacted]w[redacted]d. Cycles are 5-6 days, regular, and occur approximately every : 28 days. Her last pap smear was 2 years ago and was no abnormalities.    She had a urine pregnancy test which was positive 2 month(s)  ago. Her last menstrual period was normal and lasted for  3 or 4 day(s). Since her LMP she claims she has experienced breast tenderness, fatigue, nausea, vomiting, constipation, headaches. She denies vaginal bleeding. Her past medical history is noncontributory. Her prior pregnancies are notable for FT vaginal deliveries.  Since her LMP, she admits to the use of tobacco products  no She claims she has gained  10 pounds since the start of her pregnancy.  There are cats in the home in the home  no She admits close contact with children on a regular basis  yes  She has had chicken pox in the past yes She has had Tuberculosis exposures, symptoms, or previously tested positive for TB   no Current or past history of domestic violence. no  Genetic Screening/Teratology Counseling: (Includes patient, baby's father, or anyone in either family with:)   1. Patient's age >/= 64 at [facility]  no 2. Thalassemia ([place], [place], Mediterranean, or Asian background): MCV<80  no 3. Neural tube defect (meningomyelocele, spina bifida, anencephaly)  no 4. Congenital heart defect  no  5. Down syndrome  no 6. Tay-Sachs (Jewish, [place]  no 7. Canavan's Disease  no 8. Sickle cell disease or trait (African)  no  9. Hemophilia or other blood disorders  no  10. Muscular dystrophy  no  11. Cystic fibrosis  no  12. Huntington's Chorea   no  13. Mental retardation/autism  no 14. Other inherited genetic or chromosomal disorder  no 15. Maternal metabolic disorder (DM, PKU, etc)  no 16. Patient or FOB with a child with a birth defect not listed above no  16a. Patient or FOB with a birth defect themselves no 17. Recurrent pregnancy loss, or stillbirth  no  18. Any medications since LMP other than prenatal vitamins (include vitamins, supplements, OTC meds, drugs, alcohol)  taking Lexapro for anxiety 19. Any other genetic/environmental exposure to discuss  no  Infection History:   1. Lives with someone with TB or TB exposed  no  2. Patient or partner has history of genital herpes  no 3. Rash or viral illness since LMP  no 4. History of STI (GC, CT, HPV, syphilis, HIV)  no 5. History of recent travel :  no  Other pertinent information:  no     Review of Systems:10 point review of systems negative unless otherwise noted in HPI  Past Medical History:  Patient Active Problem List   Diagnosis Date Noted   Supervision of other normal pregnancy, antepartum 04/20/2023     Clinical Staff Provider  Office Location   Ob/Gyn Dating  Not found.  Language  English Anatomy US    Flu Vaccine  offer Genetic Screen  NIPS:   TDaP vaccine   offer Hgb A1C or  GTT Early : Third trimester :   Covid declined   LAB RESULTS   Rhogam  Blood Type     Feeding Plan breast Antibody    Contraception undecided Rubella    Circumcision yes RPR     Pediatrician  [facility]     Support Person Zackery HIV    Prenatal Classes no Varicella     GBS  (For PCN allergy, check sensitivities)   BTL Consent  Hep C     VBAC Consent  Pap No results found for: "DIAGPAP"    Hgb Electro      CF      SMA            Positive pregnancy test 03/30/2023   Bruises easily 01/03/2023   Anxiety 01/03/2023    Past Surgical History:  Past Surgical History:  Procedure Laterality Date   TONSILLECTOMY     WISDOM TOOTH EXTRACTION       Gynecologic History: Patient's last menstrual period was 02/27/2023 (exact date).  Obstetric History: [id number]  Family History:  Family History  Problem Relation Age of Onset   Diabetes Mother        gesttional   Healthy Father    Healthy Sister    Diabetes Maternal Grandmother    Hypertension Maternal Grandmother    Heart disease Maternal Grandfather    Healthy Paternal Grandmother    Diabetes Paternal Grandfather        pre diabetic   Hypertension Paternal Aunt     Social History:  Social History   Socioeconomic History   Marital status: Married    Spouse name: Nurse, children's   Number of children: 2   Years of education: 12   Highest education level: Not on file  Occupational History   Occupation: Print production planner  Tobacco Use   Smoking status: Never   Smokeless tobacco: Never  Vaping Use   Vaping Use: Never used  Substance and Sexual Activity   Alcohol use: No   Drug use: No   Sexual activity: Yes    Partners: Male    Birth control/protection: None  Other Topics Concern   Not on file  Social History Narrative   Not on file   Social Determinants of Health   Financial Resource Strain: Low Risk  (04/20/2023)   Overall Financial Resource Strain (CARDIA)    Difficulty of Paying Living Expenses: Not very hard  Food Insecurity: No Food Insecurity (04/20/2023)   Hunger Vital Sign    Worried About Running Out of Food in the Last Year: Never true    Ran Out of Food in the Last Year: Never true  Transportation Needs: No Transportation Needs (04/20/2023)   PRAPARE - Administrator, Civil Service (Medical): No    Lack of Transportation (Non-Medical): No  Physical Activity: Sufficiently Active (04/20/2023)   Exercise Vital Sign    Days of Exercise per Week: 7 days    Minutes of Exercise per Session: 30 min  Stress: No Stress Concern Present (04/20/2023)   [employer] of Occupational Health - Occupational Stress Questionnaire    Feeling of Stress : Not at  all  Social Connections: Moderately Integrated (04/20/2023)   Social Connection and Isolation Panel [NHANES]    Frequency of Communication with Friends and Family: More than three times a week    Frequency of Social Gatherings with Friends and Family: Once a week    Attends Religious Services: More than 4 times per year    Active Member of [employer] or Organizations: No    Attends Banker Meetings: Never  Marital Status: Married  Catering manager Violence: Not At Risk (04/20/2023)   Humiliation, Afraid, Rape, and Kick questionnaire    Fear of Current or Ex-Partner: No    Emotionally Abused: No    Physically Abused: No    Sexually Abused: No    Allergies:  No Known Allergies  Medications: Prior to Admission medications   Medication Sig Start Date End Date Taking? Authorizing Provider  escitalopram (LEXAPRO) 10 MG tablet TAKE 1 TABLET(10 MG) BY MOUTH DAILY 02/15/23  Yes Marianne Sofia, PA-C  prenatal vitamin w/FE, FA (NATACHEW) 29-1 MG CHEW chewable tablet Chew 2 tablets by mouth daily at 12 noon.   Yes [provider]    Physical Exam Vitals: Blood pressure 115/78, pulse 88, weight 175 lb 4.8 oz (79.5 kg), last menstrual period 02/27/2023  General: NAD HEENT: normocephalic, anicteric Thyroid: no enlargement, no palpable nodules Pulmonary: No increased work of breathing, CTAB Cardiovascular: RRR, distal pulses 2+ Abdomen: NABS, soft, non-tender, non-distended.  Umbilicus without lesions.  No hepatomegaly, splenomegaly or masses palpable. No evidence of hernia  Genitourinary: deferred for PAP interval/Aptima previously done Extremities: no edema, erythema, or tenderness Neurologic: Grossly intact Psychiatric: mood appropriate, affect full   The following were addressed during this visit:  Breastfeeding Education - Early initiation of breastfeeding    Comments: Keeps milk supply adequate, helps contract uterus and slow bleeding, and early milk is the perfect  first food and is easy to digest.   - The importance of exclusive breastfeeding    Comments: Provides antibodies, Lower risk of breast and ovarian cancers, and type-2 diabetes,Helps your body recover, Reduced chance of SIDS.   - Risks of giving your baby anything other than breast milk if you are breastfeeding    Comments: Make the baby less content with breastfeeds, may make my baby more susceptible to illness, and may reduce my milk supply.   - The importance of early skin-to-skin contact    Comments:  Keeps baby warm and secure, helps keep baby's blood sugar up and breathing steady, easier to bond and breastfeed, and helps calm baby.  - Rooming-in on a 24-hour basis    Comments: Easier to learn baby's feeding cues, easier to bond and get to know each other, and encourages milk production.   - Feeding on demand or baby-led feeding    Comments: Helps prevent breastfeeding complications, helps bring in good milk supply, prevents under or overfeeding, and helps baby feel content and satisfied   - Frequent feeding to help assure optimal milk production    Comments: Making a full supply of milk requires frequent removal of milk from breasts, infant will eat 8-12 times in 24 hours, if separated from infant use breast massage, hand expression and/ or pumping to remove milk from breasts.   - Effective positioning and attachment    Comments: Helps my baby to get enough breast milk, helps to produce an adequate milk supply, and helps prevent nipple pain and damage   - Exclusive breastfeeding for the first 6 months    Comments: Builds a healthy milk supply and keeps it up, protects baby from sickness and disease, and breastmilk has everything your baby needs for the first 6 months.  - Individualized Education    Comments: Contraindications to breastfeeding and other special medical conditions Patient has experience breastfeeding her other babies    Assessment: 34 y.o. [id number] at [redacted]w[redacted]d  presenting to initiate prenatal care  Plan: 1) Avoid alcoholic beverages. 2) Patient encouraged not to smoke.  3) Discontinue the use of all non-medicinal drugs and chemicals.  4) Take prenatal vitamins daily.  5) Nutrition, food safety (fish, cheese advisories, and high nitrite foods) and exercise discussed. 6) Hospital and practice style discussed with cross coverage system.  7) Genetic Screening, such as with 1st Trimester Screening, cell free fetal DNA, AFP testing, and Ultrasound, as well as with amniocentesis and CVS as appropriate, is discussed with patient. At the conclusion of today's visit patient declined genetic testing 8) Patient is asked about travel to areas at risk for the [place] virus, and counseled to avoid travel and exposure to mosquitoes or sexual partners who may have themselves been exposed to the virus. Testing is discussed, and will be ordered as appropriate.  9) Return for ROB in 4 weeks   Tresea Mall, CNM [facility] 05/29/2023 5:24 PM

## 2023-05-29 NOTE — Patient Instructions (Signed)
Prenatal Care Prenatal care is health care during pregnancy. It helps you and your unborn baby (fetus) stay as healthy as possible. Prenatal care may be provided by a midwife, a family practice doctor, a mid-level practitioner (nurse practitioner or physician assistant), or a childbirth and pregnancy doctor (obstetrician). How does this affect me? During pregnancy, you will be closely monitored for any new conditions that might develop. To lower your risk of pregnancy complications, you and your health care provider will talk about any underlying conditions you have. How does this affect my baby? Early and consistent prenatal care increases the chance that your baby will be healthy during pregnancy. Prenatal care lowers the risk that your baby will be: Born early (prematurely). Smaller than expected at birth (small for gestational age). What can I expect at the first prenatal care visit? Your first prenatal care visit will likely be the longest. You should schedule your first prenatal care visit as soon as you know that you are pregnant. Your first visit is a good time to talk about any questions or concerns you have about pregnancy. Medical history At your visit, you and your health care provider will talk about your medical history, including: Any past pregnancies. Your family's medical history. Medical history of the baby's father. Any long-term (chronic) health conditions you have and how you manage them. Any surgeries or procedures you have had. Any current over-the-counter or prescription medicines, herbs, or supplements that you are taking. Other factors that could pose a risk to your baby, including: Exposure to harmful chemicals or radiation at work or at home. Any substance use, including tobacco, alcohol, and drug use. Your home setting and your stress levels, including: Exposure to abuse or violence. Household financial strain. Your daily health habits, including diet and  exercise. Tests and screenings Your health care provider will: Measure your weight, height, and blood pressure. Do a physical exam, including a pelvic and breast exam. Perform blood tests and urine tests to check for: Urinary tract infection. Sexually transmitted infections (STIs). Low iron levels in your blood (anemia). Blood type and certain proteins on red blood cells (Rh antibodies). Infections and immunity to viruses, such as hepatitis B and rubella. HIV (human immunodeficiency virus). Discuss your options for genetic screening. Tips about staying healthy Your health care provider will also give you information about how to keep yourself and your baby healthy, including: Nutrition and taking vitamins. Physical activity. How to manage pregnancy symptoms such as nausea and vomiting (morning sickness). Infections and substances that may be harmful to your baby and how to avoid them. Food safety. Dental care. Working. Travel. Warning signs to watch for and when to call your health care provider. How often will I have prenatal care visits? After your first prenatal care visit, you will have regular visits throughout your pregnancy. The visit schedule is often as follows: Up to week 28 of pregnancy: once every 4 weeks. 28-36 weeks: once every 2 weeks. After 36 weeks: every week until delivery. Some women may have visits more or less often depending on any underlying health conditions and the health of the baby. Keep all follow-up and prenatal care visits. This is important. What happens during routine prenatal care visits? Your health care provider will: Measure your weight and blood pressure. Check for fetal heart sounds. Measure the height of your uterus in your abdomen (fundal height). This may be measured starting around week 20 of pregnancy. Check the position of your baby inside your uterus. Ask questions   about your diet, sleeping patterns, and whether you can feel the baby  move. Review warning signs to watch for and signs of labor. Ask about any pregnancy symptoms you are having and how you are dealing with them. Symptoms may include: Headaches. Nausea and vomiting. Vaginal discharge. Swelling. Fatigue. Constipation. Changes in your vision. Feeling persistently sad or anxious. Any discomfort, including back or pelvic pain. Bleeding or spotting. Make a list of questions to ask your health care provider at your routine visits. What tests might I have during prenatal care visits? You may have blood, urine, and imaging tests throughout your pregnancy, such as: Urine tests to check for glucose, protein, or signs of infection. Glucose tests to check for a form of diabetes that can develop during pregnancy (gestational diabetes mellitus). This is usually done around week 24 of pregnancy. Ultrasounds to check your baby's growth and development, to check for birth defects, and to check your baby's well-being. These can also help to decide when you should deliver your baby. A test to check for group B strep (GBS) infection. This is usually done around week 36 of pregnancy. Genetic testing. This may include blood, fluid, or tissue sampling, or imaging tests, such as an ultrasound. Some genetic tests are done during the first trimester and some are done during the second trimester. What else can I expect during prenatal care visits? Your health care provider may recommend getting certain vaccines during pregnancy. These may include: A yearly flu shot (annual influenza vaccine). This is especially important if you will be pregnant during flu season. Tdap (tetanus, diphtheria, pertussis) vaccine. Getting this vaccine during pregnancy can protect your baby from whooping cough (pertussis) after birth. This vaccine may be recommended between weeks 27 and 36 of pregnancy. A COVID-19 vaccine. Later in your pregnancy, your health care provider may give you information  about: Childbirth and breastfeeding classes. Choosing a health care provider for your baby. Umbilical cord banking. Breastfeeding. Birth control after your baby is born. The hospital labor and delivery unit and how to set up a tour. Registering at the hospital before you go into labor. Where to find more information Office on Women's Health: womenshealth.gov American Pregnancy Association: americanpregnancy.org March of Dimes: marchofdimes.org Summary Prenatal care helps you and your baby stay as healthy as possible during pregnancy. Your first prenatal care visit will most likely be the longest. You will have visits and tests throughout your pregnancy to monitor your health and your baby's health. Bring a list of questions to your visits to ask your health care provider. Make sure to keep all follow-up and prenatal care visits. This information is not intended to replace advice given to you by your health care provider. Make sure you discuss any questions you have with your health care provider. Document Revised: 09/02/2020 Document Reviewed: 09/02/2020 Elsevier Patient Education  2024 [employer] Exercise During Pregnancy Exercise is an important part of being healthy for people of all ages. Exercise improves the function of your heart and lungs and helps you maintain strength, flexibility, and a healthy body weight. Exercise also boosts energy levels and elevates mood. Most women should exercise regularly during pregnancy. Exercise routines may need to change as your pregnancy progresses. In rare cases, women with certain medical conditions or complications may be asked to limit or avoid exercise during pregnancy. Your health care provider will give you information on what will work for you. How does this affect me? Along with maintaining general strength and flexibility, exercising during   pregnancy can help: Keep strength in muscles that are used during labor and  childbirth. Decrease low back pain or symptoms of depression. Control weight gain during pregnancy. Reduce the risk of needing insulin if you develop diabetes during pregnancy. Decrease the risk of cesarean delivery. Speed up your recovery after giving birth. Relieve constipation. How does this affect my baby? Exercise can help you have a healthy pregnancy. Exercise does not cause early (premature) birth. It will not cause your baby to weigh less at birth. What exercises can I do? Many exercises are safe for you to do during pregnancy. Do a variety of exercises that safely increase your heart and breathing rates and help you build and maintain muscle strength. Do exercises exactly as told by your health care provider. You may do these exercises: Walking. Swimming. Water aerobics. Riding a stationary bike. Modified yoga or Pilates. Tell your instructor that you are pregnant. Avoid overstretching, and avoid lying on your back for long periods of time. Running or jogging. Choose this type of exercise only if: You ran or jogged regularly before your pregnancy. You can run or jog and still talk in complete sentences. What exercises should I avoid? You may be told to limit high-intensity exercise depending on your level of fitness and whether you exercised regularly before you were pregnant. You can tell that you are exercising at a high intensity if you are breathing much harder and faster and cannot hold a conversation while exercising. You must avoid: Contact sports. Activities that put you at risk for falling on or being hit in the belly, such as downhill skiing, waterskiing, surfing, rock climbing, cycling, gymnastics, and horseback riding. Scuba diving. Skydiving. Hot yoga or hot Pilates. These activities take place in a room that is heated to high temperatures. Jogging or running, unless you jogged or ran regularly before you were pregnant. While jogging or running, you should always be  able to talk in full sentences. Do not run or jog so fast that you are unable to have a conversation. Do not exercise at more than 6,000 feet above sea level (high elevation) if you are not used to exercising at high elevation. How do I exercise in a safe way?  Avoid overheating. Do not exercise in very high temperatures. Wear loose-fitting, breathable clothes. Avoid dehydration. Drink enough fluid before, during, and after exercise to keep your urine pale yellow. Avoid overstretching. Because of hormone changes during pregnancy, it is easy to overstretch muscles, tendons, and ligaments. Start slowly and ask your health care provider to recommend the types of exercise that are safe for you. Do not exercise to lose weight. Wear a sports bra to support your breasts. Avoid standing still or lying flat on your back as much as you can. Follow these instructions at home: Exercise on most days or all days of the week. Try to exercise for 30 minutes a day, 5 days a week, unless your health care provider tells you not to. If you actively exercised before your pregnancy and you are healthy, your health care provider may tell you to continue to do moderate-intensity to high-intensity exercise. If you are just starting to exercise or did not exercise much before your pregnancy, your health care provider may tell you to do low-intensity to moderate-intensity exercise. Questions to ask your health care provider Is exercise safe for me? What are signs that I should stop exercising? Does my health condition mean that I should not exercise during pregnancy? When should I   avoid exercising during pregnancy? Stop exercising and contact a health care provider if: You have any unusual symptoms such as: Mild contractions of the uterus or cramps in the abdomen. A dizzy feeling that does not go away when you rest. Stop exercising and get help right away if: You have any unusual symptoms such as: Sudden, severe  pain in your low back or your belly. Regular, painful contractions of your uterus. Chest pain. Bleeding or fluid leaking from your vagina. Shortness of breath. Headache. Pain and swelling of your calves. Summary Most women should exercise regularly throughout pregnancy. In rare cases, women with certain medical conditions or complications may be asked to limit or avoid exercise during pregnancy. Do not exercise to lose weight during pregnancy. Your health care provider will tell you what level of physical activity is right for you. Stop exercising and contact a health care provider if you have unusual symptoms, such as mild contractions or dizziness. This information is not intended to replace advice given to you by your health care provider. Make sure you discuss any questions you have with your health care provider. Document Revised: 07/07/2020 Document Reviewed: 07/07/2020 Elsevier Patient Education  2024 [employer]. Eating Plan for Pregnant Women While you are pregnant, your body requires additional nutrition to help support your growing baby. You also have a higher need for some vitamins and minerals, such as folic acid, calcium, iron, and vitamin D. Eating a healthy, well-balanced diet is very important for your health and your baby's health. Your need for extra calories varies over the course of your pregnancy. Pregnancy is divided into three trimesters, with each trimester lasting 3 months. For most women, it is recommended to consume: 150 extra calories a day during the first trimester. 300 extra calories a day during the second trimester. 300 extra calories a day during the third trimester. What are tips for following this plan? Cooking Practice good food safety and cleanliness. Wash your hands before you eat and after you prepare raw meat. Wash all fruits and vegetables well before peeling or eating. Taking these actions can help to prevent foodborne illnesses that can be very  dangerous to your baby, such as listeriosis. Ask your health care provider for more information about listeriosis. Make sure that all meats, poultry, and eggs are cooked to food-safe temperatures or "well-done." Meal planning  Eat a variety of foods (especially fruits and vegetables) to get a full range of vitamins and minerals. Two or more servings of fish are recommended each week in order to get the most benefits from omega-3 fatty acids that are found in seafood. Choose fish that are lower in mercury, such as salmon and pollock. Limit your overall intake of foods that have "empty calories." These are foods that have little nutritional value, such as sweets, desserts, candies, and sugar-sweetened beverages. Drinks that contain caffeine are okay to drink, but it is better to avoid caffeine. Keep your total caffeine intake to less than 200 mg each day (which is 12 oz or 355 mL of coffee, tea, or soda) or the limit as told by your health care provider. General information Do not try to lose weight or go on a diet during pregnancy. Take a prenatal vitamin to help meet your additional vitamin and mineral needs during pregnancy, specifically for folic acid, iron, calcium, and vitamin D. Remember to stay active. Ask your health care provider what types of exercise and activities are safe for you. What does 150 extra calories look like?   Healthy options that provide 150 extra calories each day could be any of the following: 6-8 oz (170-227 g) plain low-fat yogurt with  cup (70 g) berries. 1 apple with 2 tsp (11 g) peanut butter. Cut-up vegetables with  cup (60 g) hummus. 8 fl oz (237 mL) low-fat chocolate milk. 1 stick of string cheese with 1 medium orange. 1 peanut butter and jelly sandwich that is made with one slice of whole-wheat bread and 1 tsp (5 g) of peanut butter. For 300 extra calories, you could eat two of these healthy options each day. What is a healthy amount of weight to gain? The  right amount of weight gain for you is based on your BMI (body mass index) before you became pregnant. If your BMI was less than 18 (underweight), you should gain 28-40 lb (13-18 kg). If your BMI was 18-24.9 (normal), you should gain 25-35 lb (11-16 kg). If your BMI was 25-29.9 (overweight), you should gain 15-25 lb (7-11 kg). If your BMI was 30 or greater (obese), you should gain 11-20 lb (5-9 kg). What if I am having twins or multiples? Generally, if you are carrying twins or multiples: You may need to eat 300-600 extra calories a day. The recommended range for total weight gain is 25-54 lb (11-25 kg), depending on your BMI before pregnancy. Talk with your health care provider to find out about nutritional needs, weight gain, and exercise that is right for you. What foods should I eat?  Fruits All fruits. Eat a variety of colors and types of fruit. Remember to wash your fruits well before peeling or eating. Vegetables All vegetables. Eat a variety of colors and types of vegetables. Remember to wash your vegetables well before peeling or eating. Grains All grains. Choose whole grains, such as whole-wheat bread, oatmeal, or brown rice. Meats and other protein foods Lean meats, including chicken, turkey, and lean cuts of beef, veal, or pork. Fish that is higher in omega-3 fatty acids and lower in mercury, such as salmon, herring, mussels, trout, sardines, pollock, shrimp, crab, and lobster. Tofu. Tempeh. Beans. Eggs. Peanut butter and other nut butters. Dairy Pasteurized milk and milk alternatives, such as almond milk. Pasteurized yogurt and pasteurized cheese. Cottage cheese. Sour cream. Beverages Water. Juices that contain 100% fruit juice or vegetable juice. Caffeine-free teas and decaffeinated coffee. Fats and oils Fats and oils are okay to include in moderation. Sweets and desserts Sweets and desserts are okay to include in moderation. Seasoning and other foods All pasteurized  condiments. The items listed above may not be a complete list of foods and beverages you can eat. Contact a dietitian for more information. What foods should I avoid? Fruits Raw (unpasteurized) fruit juices. Vegetables Unpasteurized vegetable juices. Meats and other protein foods Precooked or cured meat, such as bologna, hot dogs, sausages, or meat loaves. (If you must eat those meats, reheat them until they are steaming hot.) Refrigerated pate, meat spreads from a meat counter, or smoked seafood that is found in the refrigerated section of a store. Raw or undercooked meats, poultry, and eggs. Raw fish, such as sushi or sashimi. Fish that have high mercury content, such as tilefish, shark, swordfish, and king mackerel. Dairy Unpasteurized milk and any foods that have unpasteurized milk in them. Soft cheeses, such as feta, queso blanco, queso fresco, Brie, Camembert, panela, and blue-veined cheeses (unless they are made with pasteurized milk, which must be stated on the label). Beverages Alcohol. Sugar-sweetened beverages, such as sodas, teas, or   energy drinks. Seasoning and other foods Homemade fermented foods and drinks, such as pickles, sauerkraut, or kombucha drinks. (Store-bought pasteurized versions of these are okay.) Salads that are made in a store or deli, such as ham salad, chicken salad, egg salad, tuna salad, and seafood salad. The items listed above may not be a complete list of foods and beverages you should avoid. Contact a dietitian for more information. Where to find more information To calculate the number of calories you need based on your height, weight, and activity level, you can use an online calculator such as: www.myplate.gov/myplate-plan To calculate how much weight you should gain during pregnancy, you can use an online pregnancy weight gain calculator such as: www.myplate.gov To learn more about eating fish during pregnancy, talk with your health care provider or  visit: www.fda.gov Summary While you are pregnant, your body requires additional nutrition to help support your growing baby. Eat a variety of foods, especially fruits and vegetables, to get a full range of vitamins and minerals. Practice good food safety and cleanliness. Wash your hands before you eat and after you prepare raw meat. Wash all fruits and vegetables well before peeling or eating. Taking these actions can help to prevent foodborne illnesses, such as listeriosis, that can be very dangerous to your baby. Do not eat raw meat or fish. Do not eat fish that have high mercury content, such as tilefish, shark, swordfish, and king mackerel. Do not eat raw (unpasteurized) dairy. Take a prenatal vitamin to help meet your additional vitamin and mineral needs during pregnancy, specifically for folic acid, iron, calcium, and vitamin D. This information is not intended to replace advice given to you by your health care provider. Make sure you discuss any questions you have with your health care provider. Document Revised: 06/17/2020 Document Reviewed: 06/17/2020 Elsevier Patient Education  2024 Elsevier Inc.  

## 2023-06-04 ENCOUNTER — Other Ambulatory Visit: Payer: Self-pay | Admitting: Physician Assistant

## 2023-06-04 DIAGNOSIS — F419 Anxiety disorder, unspecified: Secondary | ICD-10-CM

## 2023-06-04 NOTE — Telephone Encounter (Signed)
Please call pt and verify that GYN is agreeable for pt to stay on citalopram through pregnancy

## 2023-06-04 NOTE — Telephone Encounter (Signed)
Left message for patient to call our office.

## 2023-06-29 ENCOUNTER — Ambulatory Visit (INDEPENDENT_AMBULATORY_CARE_PROVIDER_SITE_OTHER): Payer: 59 | Admitting: Certified Nurse Midwife

## 2023-06-29 VITALS — BP 99/66 | HR 94 | Wt 174.0 lb

## 2023-06-29 DIAGNOSIS — Z3480 Encounter for supervision of other normal pregnancy, unspecified trimester: Secondary | ICD-10-CM

## 2023-06-29 DIAGNOSIS — Z3A17 17 weeks gestation of pregnancy: Secondary | ICD-10-CM

## 2023-06-29 DIAGNOSIS — F419 Anxiety disorder, unspecified: Secondary | ICD-10-CM

## 2023-06-29 DIAGNOSIS — O99340 Other mental disorders complicating pregnancy, unspecified trimester: Secondary | ICD-10-CM

## 2023-06-29 DIAGNOSIS — O99342 Other mental disorders complicating pregnancy, second trimester: Secondary | ICD-10-CM

## 2023-06-29 LAB — POCT URINALYSIS DIPSTICK OB
Bilirubin, UA: NEGATIVE
Blood, UA: NEGATIVE
Glucose, UA: NEGATIVE
Ketones, UA: NEGATIVE
Leukocytes, UA: NEGATIVE
Nitrite, UA: NEGATIVE
POC,PROTEIN,UA: NEGATIVE
Spec Grav, UA: 1.015 (ref 1.010–1.025)
Urobilinogen, UA: 1 E.U./dL
pH, UA: 6.5 (ref 5.0–8.0)

## 2023-06-29 NOTE — Progress Notes (Unsigned)
   PRENATAL VISIT NOTE  Subjective:  Melinda Evans is a 32 y.o. G3P2002 at [redacted]w[redacted]d being seen today for ongoing prenatal care.  She is currently monitored for the following issues for this {:19197::"high-risk","low-risk"} pregnancy and has Bruises easily; Anxiety; Positive pregnancy test; and Supervision of other normal pregnancy, antepartum on their problem list.  Patient reports {[id number]}.  Contractions: Not present. Vag. Bleeding: None.  Movement: Absent. Denies leaking of fluid.   The following portions of the patient's history were reviewed and updated as appropriate: allergies, current medications, past family history, past medical history, past social history, past surgical history and problem list.   Objective:   Vitals:   06/29/23 1520  BP: 99/66  Pulse: 94  Weight: 174 lb (78.9 kg)   Total weight gain: 9 lb (4.082 kg) Fetal Status:     Movement: Absent      General:  Alert, oriented and cooperative. Patient is in no acute distress.  Skin: Skin is warm and dry. No rash noted.   Cardiovascular: Normal heart rate noted  Respiratory: Normal respiratory effort, no problems with respiration noted  Abdomen: Soft, gravid, appropriate for gestational age.  Pain/Pressure: Absent     Pelvic: {:19197::"Cervical exam performed in the presence of a chaperone","Cervical exam deferred"}        Extremities: Normal range of motion.     Mental Status: Normal mood and affect. Normal behavior. Normal judgment and thought content.   Assessment and Plan:  Pregnancy: G3P2002 at [redacted]w[redacted]d 1. Supervision of other normal pregnancy, antepartum  2. [redacted] weeks gestation of pregnancy  3. Anxiety during pregnancy   {:19197::"Term","Preterm"} labor symptoms and general obstetric precautions including but not limited to vaginal bleeding, contractions, leaking of fluid and fetal movement were reviewed in detail with the patient. Please refer to After Visit Summary for other counseling recommendations.    Return in 4 weeks (on 07/27/2023) for ROB.  No future appointments.  Dominica Severin, CNM

## 2023-06-29 NOTE — Patient Instructions (Signed)

## 2023-07-02 NOTE — Telephone Encounter (Signed)
Pt called back stating that she spoke with both midwifes and was told that it was fine to continue the citalopram.  Pharmacy: [facility] in [place].

## 2023-07-16 ENCOUNTER — Telehealth: Payer: Self-pay

## 2023-07-16 ENCOUNTER — Other Ambulatory Visit: Payer: Self-pay | Admitting: Family Medicine

## 2023-07-16 DIAGNOSIS — F419 Anxiety disorder, unspecified: Secondary | ICD-10-CM

## 2023-07-16 MED ORDER — ESCITALOPRAM OXALATE 10 MG PO TABS: 10.0000 mg | ORAL_TABLET | Freq: Every day | ORAL | 3 refills | Status: DC

## 2023-07-16 NOTE — Telephone Encounter (Signed)
Left message that medication was sent.

## 2023-07-16 NOTE — Telephone Encounter (Signed)
Patient called and left voicemail stating that she still has not received refill on her Lexapro, she states that she called last week to tell us that she called her OBGYN who stated that patient can stay on the Lexapro. Please advise.

## 2023-07-19 ENCOUNTER — Ambulatory Visit (INDEPENDENT_AMBULATORY_CARE_PROVIDER_SITE_OTHER): Payer: Self-pay

## 2023-07-19 DIAGNOSIS — Z3482 Encounter for supervision of other normal pregnancy, second trimester: Secondary | ICD-10-CM

## 2023-07-19 DIAGNOSIS — Z3480 Encounter for supervision of other normal pregnancy, unspecified trimester: Secondary | ICD-10-CM

## 2023-07-19 DIAGNOSIS — Z3A20 20 weeks gestation of pregnancy: Secondary | ICD-10-CM

## 2023-07-19 DIAGNOSIS — Z3689 Encounter for other specified antenatal screening: Secondary | ICD-10-CM

## 2023-07-24 ENCOUNTER — Other Ambulatory Visit: Payer: Self-pay | Admitting: Certified Nurse Midwife

## 2023-07-24 DIAGNOSIS — Z3480 Encounter for supervision of other normal pregnancy, unspecified trimester: Secondary | ICD-10-CM

## 2023-07-24 DIAGNOSIS — Z363 Encounter for antenatal screening for malformations: Secondary | ICD-10-CM

## 2023-07-27 ENCOUNTER — Ambulatory Visit: Payer: Self-pay

## 2023-07-27 VITALS — BP 120/80 | Wt 173.0 lb

## 2023-07-27 DIAGNOSIS — G43909 Migraine, unspecified, not intractable, without status migrainosus: Secondary | ICD-10-CM | POA: Insufficient documentation

## 2023-07-27 DIAGNOSIS — G43809 Other migraine, not intractable, without status migrainosus: Secondary | ICD-10-CM

## 2023-07-27 DIAGNOSIS — Z3480 Encounter for supervision of other normal pregnancy, unspecified trimester: Secondary | ICD-10-CM

## 2023-07-27 MED ORDER — BUTALBITAL-APAP-CAFFEINE 50-325-40 MG PO CAPS
1.0000 | ORAL_CAPSULE | Freq: Four times a day (QID) | ORAL | 1 refills | Status: DC | PRN
Start: 2023-07-27 — End: 2023-09-13

## 2023-07-27 NOTE — Progress Notes (Signed)
    Return Prenatal Note   Assessment/Plan   Plan  32 y.o. [id number] at [redacted]w[redacted]d presents for follow-up OB visit. Reviewed prenatal record including previous visit note.  Migraines - Tylenol not providing relief with occasional migraines. Rx for Fioricet provided.   Supervision of other normal pregnancy, antepartum - Has concerns about weight gain since she has lost about half a pound since her last visit. Reviewed normal TWG and normal growth at anatomy ultrasound. Provided reassurance for fluctuations in weight gain. Encouraged small, frequent, high protein meals.  - Has follow up US on 9/6 for incomplete views on anatomy US.  - Reviewed comfort measures for rash on lower abdomen including use of hydrocortisone cream or antifungal.  - Reviewed red flag warning signs anticipatory guidance for upcoming prenatal care.    No orders of the defined types were placed in this encounter.  Return in about 4 weeks (around 08/24/2023) for ROB.   Future Appointments  Date Time Provider Department Center  08/10/2023  3:00 PM [facility] [place] [facility] None  08/23/2023  1:55 PM Doreene Burke, CNM [facility] None    For next visit:  continue with routine prenatal care     Subjective   32 y.o. [id number] at [redacted]w[redacted]d presents for this follow-up prenatal visit.  Patient has concerns about occasional migraines. She has tried Tylenol without much relief. Also concerned about lack of weight gain in last few weeks and rash on lower abdomen. Patient reports: Movement: Present Contractions: Not present  Objective   Flow sheet Vitals: BP: 120/80 Fundal Height: 21 cm Fetal Heart Rate (bpm): 145 Total weight gain: 8 lb (3.629 kg)  General Appearance  No acute distress, well appearing, and well nourished Pulmonary   Normal work of breathing Neurologic   Alert and oriented to person, place, and time Psychiatric   Mood and affect within normal limits  Lindalou Hose Seanpaul Preece, CNM  08/23/243:58 PM

## 2023-07-27 NOTE — Assessment & Plan Note (Addendum)
-   Has concerns about weight gain since she has lost about half a pound since her last visit. Reviewed normal TWG and normal growth at anatomy ultrasound. Provided reassurance for fluctuations in weight gain. Encouraged small, frequent, high protein meals.  - Has follow up US on 9/6 for incomplete views on anatomy US.  - Reviewed comfort measures for rash on lower abdomen including use of hydrocortisone cream or antifungal.  - Reviewed red flag warning signs anticipatory guidance for upcoming prenatal care.

## 2023-07-27 NOTE — Assessment & Plan Note (Signed)
-   Tylenol not providing relief with occasional migraines. Rx for Fioricet provided.

## 2023-08-10 ENCOUNTER — Ambulatory Visit (INDEPENDENT_AMBULATORY_CARE_PROVIDER_SITE_OTHER): Payer: Medicaid Other

## 2023-08-10 DIAGNOSIS — Z3A23 23 weeks gestation of pregnancy: Secondary | ICD-10-CM | POA: Diagnosis not present

## 2023-08-10 DIAGNOSIS — Z363 Encounter for antenatal screening for malformations: Secondary | ICD-10-CM

## 2023-08-10 DIAGNOSIS — Z3482 Encounter for supervision of other normal pregnancy, second trimester: Secondary | ICD-10-CM

## 2023-08-10 DIAGNOSIS — Z3480 Encounter for supervision of other normal pregnancy, unspecified trimester: Secondary | ICD-10-CM

## 2023-08-20 ENCOUNTER — Encounter: Payer: Self-pay | Admitting: Certified Nurse Midwife

## 2023-08-23 ENCOUNTER — Other Ambulatory Visit (HOSPITAL_COMMUNITY)
Admission: RE | Admit: 2023-08-23 | Discharge: 2023-08-23 | Disposition: A | Discharge status: Home / Self Care | Payer: Medicaid Other | Source: Ambulatory Visit | Attending: Certified Nurse Midwife | Admitting: Certified Nurse Midwife

## 2023-08-23 ENCOUNTER — Other Ambulatory Visit: Payer: Medicaid Other

## 2023-08-23 ENCOUNTER — Ambulatory Visit (INDEPENDENT_AMBULATORY_CARE_PROVIDER_SITE_OTHER): Payer: Medicaid Other | Admitting: Certified Nurse Midwife

## 2023-08-23 VITALS — BP 107/72 | HR 79 | Wt 177.6 lb

## 2023-08-23 DIAGNOSIS — B3731 Acute candidiasis of vulva and vagina: Secondary | ICD-10-CM | POA: Diagnosis not present

## 2023-08-23 DIAGNOSIS — B9689 Other specified bacterial agents as the cause of diseases classified elsewhere: Secondary | ICD-10-CM | POA: Diagnosis not present

## 2023-08-23 DIAGNOSIS — L292 Pruritus vulvae: Secondary | ICD-10-CM | POA: Insufficient documentation

## 2023-08-23 DIAGNOSIS — O23592 Infection of other part of genital tract in pregnancy, second trimester: Secondary | ICD-10-CM | POA: Insufficient documentation

## 2023-08-23 DIAGNOSIS — Z3A25 25 weeks gestation of pregnancy: Secondary | ICD-10-CM | POA: Insufficient documentation

## 2023-08-23 DIAGNOSIS — O26892 Other specified pregnancy related conditions, second trimester: Secondary | ICD-10-CM | POA: Diagnosis not present

## 2023-08-23 DIAGNOSIS — O98812 Other maternal infectious and parasitic diseases complicating pregnancy, second trimester: Secondary | ICD-10-CM | POA: Diagnosis not present

## 2023-08-23 NOTE — Progress Notes (Signed)
Rob doing well, feeling good movement. PT c/o rash on her abdomen that started on Monday. She went to urgent care and the gave her prednisone . This has not really helped. On Thursday she started having itching and pain vaginally. She notes the pain on the out side in the folds of the labia. She denies internal vaginal itching. On exam pin point patches of rash noted on the lower abdomen only . Excoriation marks noted as well. Pt does have history of eczema. Not likely pupps given affect lower abdomen only. Discussed maybe irritation for pants on her lower abdomen. Discussed use of Claritin/benadryl. Encourage coconut oil or vitamin E to affected area given pt has sensitive skin. Spec exam: white thick paste in folds of labia, mild redness. Tender to touch. Suspect yeast. Vaginally, mild redness, white discharge, no lessions or ulcers noted. Vaginal swab collected. Recommend monistat to labia for suspected exteral yeast irritation. Will follow up with swab results. Pt agrees to plan. Discussed vistiril for itching if other methods not effective to control itching.   Discussed glucose screen next visit in 3 wks.   Reviewed eating prior to testing. She verbalizes and agrees.   Follow up 3 weeks.   Doreene Burke, CNM

## 2023-08-23 NOTE — Patient Instructions (Signed)
Oral Glucose Tolerance Test During Pregnancy Why am I having this test? The oral glucose tolerance test (OGTT) is done to check how your body processes blood sugar (glucose). This is one of several tests used to diagnose diabetes that develops during pregnancy (gestational diabetes mellitus). Gestational diabetes is a short-term form of diabetes that some women develop while they are pregnant. It usually occurs during the second trimester of pregnancy and goes away after delivery. Testing, or screening, for gestational diabetes usually occurs at weeks 24-28 of pregnancy. You may have the OGTT test after having a 1-hour glucose screening test if the results from that test indicate that you may have gestational diabetes. This test may also be needed if: You have a history of gestational diabetes. There is a history of giving birth to very large babies or of losing pregnancies (having stillbirths). You have signs and symptoms of diabetes, such as: Changes in your eyesight. Tingling or numbness in your hands or feet. Changes in hunger, thirst, and urination, and these are not explained by your pregnancy. What is being tested? This test measures the amount of glucose in your blood at different times during a period of 3 hours. This shows how well your body can process glucose. What kind of sample is taken?  Blood samples are required for this test. They are usually collected by inserting a needle into a blood vessel. How do I prepare for this test? For 3 days before your test, eat normally. Have plenty of carbohydrate-rich foods. Follow instructions from your health care provider about: Eating or drinking restrictions on the day of the test. You may be asked not to eat or drink anything other than water (to fast) starting 8-10 hours before the test. Changing or stopping your regular medicines. Some medicines may interfere with this test. Tell a health care provider about: All medicines you are  taking, including vitamins, herbs, eye drops, creams, and over-the-counter medicines. Any blood disorders you have. Any surgeries you have had. Any medical conditions you have. What happens during the test? First, your blood glucose will be measured. This is referred to as your fasting blood glucose because you fasted before the test. Then, you will drink a glucose solution that contains a certain amount of glucose. Your blood glucose will be measured again 1, 2, and 3 hours after you drink the solution. This test takes about 3 hours to complete. You will need to stay at the testing location during this time. During the testing period: Do not eat or drink anything other than the glucose solution. Do not exercise. Do not use any products that contain nicotine or tobacco, such as cigarettes, e-cigarettes, and chewing tobacco. These can affect your test results. If you need help quitting, ask your health care provider. The testing procedure may vary among health care providers and hospitals. How are the results reported? Your results will be reported as milligrams of glucose per deciliter of blood (mg/dL) or millimoles per liter (mmol/L). There is more than one source for screening and diagnosis reference values used to diagnose gestational diabetes. Your health care provider will compare your results to normal values that were established after testing a large group of people (reference values). Reference values may vary among labs and hospitals. For this test (Carpenter-Coustan), reference values are: Fasting: 95 mg/dL (5.3 mmol/L). 1 hour: 180 mg/dL (86.5 mmol/L). 2 hour: 155 mg/dL (8.6 mmol/L). 3 hour: 140 mg/dL (7.8 mmol/L). What do the results mean? Results below the reference values are  considered normal. If two or more of your blood glucose levels are at or above the reference values, you may be diagnosed with gestational diabetes. If only one level is high, your health care provider may  suggest repeat testing or other tests to confirm a diagnosis. Talk with your health care provider about what your results mean. Questions to ask your health care provider Ask your health care provider, or the department that is doing the test: When will my results be ready? How will I get my results? What are my treatment options? What other tests do I need? What are my next steps? Summary The oral glucose tolerance test (OGTT) is one of several tests used to diagnose diabetes that develops during pregnancy (gestational diabetes mellitus). Gestational diabetes is a short-term form of diabetes that some women develop while they are pregnant. You may have the OGTT test after having a 1-hour glucose screening test if the results from that test show that you may have gestational diabetes. You may also have this test if you have any symptoms or risk factors for this type of diabetes. Talk with your health care provider about what your results mean. This information is not intended to replace advice given to you by your health care provider. Make sure you discuss any questions you have with your health care provider. Document Revised: 06/27/2022 Document Reviewed: 04/29/2020 Elsevier Patient Education  2024 [employer].

## 2023-08-27 LAB — CERVICOVAGINAL ANCILLARY ONLY
Bacterial Vaginitis (gardnerella): POSITIVE — AB
Candida Glabrata: NEGATIVE
Candida Vaginitis: POSITIVE — AB
Comment: NEGATIVE
Comment: NEGATIVE
Comment: NEGATIVE

## 2023-08-28 ENCOUNTER — Other Ambulatory Visit: Payer: Self-pay | Admitting: Certified Nurse Midwife

## 2023-08-28 ENCOUNTER — Encounter: Payer: Self-pay | Admitting: Certified Nurse Midwife

## 2023-08-28 MED ORDER — METRONIDAZOLE 500 MG PO TABS
500.0000 mg | ORAL_TABLET | Freq: Two times a day (BID) | ORAL | 0 refills | Status: AC
Start: 2023-08-28 — End: 2023-09-04

## 2023-08-28 MED ORDER — FLUCONAZOLE 150 MG PO TABS: 150.0000 mg | ORAL_TABLET | Freq: Once | ORAL | 1 refills | Status: AC

## 2023-09-12 ENCOUNTER — Other Ambulatory Visit: Payer: Self-pay

## 2023-09-12 DIAGNOSIS — Z3A28 28 weeks gestation of pregnancy: Secondary | ICD-10-CM

## 2023-09-12 DIAGNOSIS — Z113 Encounter for screening for infections with a predominantly sexual mode of transmission: Secondary | ICD-10-CM

## 2023-09-12 DIAGNOSIS — Z130 Encounter for screening for diseases of the blood and blood-forming organs and certain disorders involving the immune mechanism: Secondary | ICD-10-CM

## 2023-09-12 DIAGNOSIS — Z131 Encounter for screening for diabetes mellitus: Secondary | ICD-10-CM

## 2023-09-12 DIAGNOSIS — Z3483 Encounter for supervision of other normal pregnancy, third trimester: Secondary | ICD-10-CM

## 2023-09-13 ENCOUNTER — Other Ambulatory Visit: Payer: Medicaid Other

## 2023-09-13 ENCOUNTER — Ambulatory Visit: Payer: Medicaid Other

## 2023-09-13 VITALS — BP 110/75 | HR 73 | Wt 177.2 lb

## 2023-09-13 DIAGNOSIS — Z23 Encounter for immunization: Secondary | ICD-10-CM

## 2023-09-13 DIAGNOSIS — Z3480 Encounter for supervision of other normal pregnancy, unspecified trimester: Secondary | ICD-10-CM

## 2023-09-13 DIAGNOSIS — Z131 Encounter for screening for diabetes mellitus: Secondary | ICD-10-CM

## 2023-09-13 DIAGNOSIS — Z3A28 28 weeks gestation of pregnancy: Secondary | ICD-10-CM

## 2023-09-13 DIAGNOSIS — Z3483 Encounter for supervision of other normal pregnancy, third trimester: Secondary | ICD-10-CM

## 2023-09-13 DIAGNOSIS — Z113 Encounter for screening for infections with a predominantly sexual mode of transmission: Secondary | ICD-10-CM

## 2023-09-13 DIAGNOSIS — Z130 Encounter for screening for diseases of the blood and blood-forming organs and certain disorders involving the immune mechanism: Secondary | ICD-10-CM

## 2023-09-13 DIAGNOSIS — G43809 Other migraine, not intractable, without status migrainosus: Secondary | ICD-10-CM

## 2023-09-13 MED ORDER — BUTALBITAL-APAP-CAFFEINE 50-325-40 MG PO CAPS: 1.0000 | ORAL_CAPSULE | Freq: Four times a day (QID) | ORAL | 1 refills | Status: DC | PRN

## 2023-09-13 NOTE — Assessment & Plan Note (Addendum)
-   GDM screening, third trimester labs, BTFC signed, and Tdap today in clinic. Declines flu vaccine. - Reviewed kick counts and preterm labor warning signs. Instructed to call office or come to hospital with persistent headache, vision changes, regular contractions, leaking of fluid, decreased fetal movement or vaginal bleeding.

## 2023-09-13 NOTE — Progress Notes (Signed)
    Return Prenatal Note   Assessment/Plan   Plan  32 y.o. [id number] at [redacted]w[redacted]d presents for follow-up OB visit. Reviewed prenatal record including previous visit note.  Supervision of other normal pregnancy, antepartum - GDM screening, third trimester labs, BTFC signed, and Tdap today in clinic. Declines flu vaccine. - Reviewed kick counts and preterm labor warning signs. Instructed to call office or come to hospital with persistent headache, vision changes, regular contractions, leaking of fluid, decreased fetal movement or vaginal bleeding.   Orders Placed This Encounter  Procedures   Tdap vaccine greater than or equal to 7yo IM   No follow-ups on file.   Future Appointments  Date Time Provider Department Center  09/13/2023  9:55 AM Zayon Trulson, Lindalou Hose, CNM [facility] None    For next visit:  continue with routine prenatal care     Subjective   32 y.o. [id number] at [redacted]w[redacted]d presents for this follow-up prenatal visit.  Patient has no concerns. Patient reports: Movement: Present Contractions: Not present  Objective   Flow sheet Vitals: Pulse Rate: 73 BP: 110/75 Fundal Height: 28 cm Fetal Heart Rate (bpm): 135 Total weight gain: 12 lb 3.2 oz (5.534 kg)  General Appearance  No acute distress, well appearing, and well nourished Pulmonary   Normal work of breathing Neurologic   Alert and oriented to person, place, and time Psychiatric   Mood and affect within normal limits  Lindalou Hose Dione Mccombie, CNM  10/10/249:28 AM

## 2023-09-14 LAB — 28 WEEK RH+PANEL
Basophils Absolute: 0 10*3/uL (ref 0.0–0.2)
Basos: 0 %
EOS (ABSOLUTE): 0.1 10*3/uL (ref 0.0–0.4)
Eos: 2 %
Gestational Diabetes Screen: 107 mg/dL (ref 70–139)
HIV Screen 4th Generation wRfx: NONREACTIVE
Hematocrit: 33.2 % — ABNORMAL LOW (ref 34.0–46.6)
Hemoglobin: 11 g/dL — ABNORMAL LOW (ref 11.1–15.9)
Immature Grans (Abs): 0.1 10*3/uL (ref 0.0–0.1)
Immature Granulocytes: 2 %
Lymphocytes Absolute: 1.2 10*3/uL (ref 0.7–3.1)
Lymphs: 19 %
MCH: 32.8 pg (ref 26.6–33.0)
MCHC: 33.1 g/dL (ref 31.5–35.7)
MCV: 99 fL — ABNORMAL HIGH (ref 79–97)
Monocytes Absolute: 0.3 10*3/uL (ref 0.1–0.9)
Monocytes: 5 %
Neutrophils Absolute: 4.4 10*3/uL (ref 1.4–7.0)
Neutrophils: 72 %
Platelets: 174 10*3/uL (ref 150–450)
RBC: 3.35 x10E6/uL — ABNORMAL LOW (ref 3.77–5.28)
RDW: 13 % (ref 11.7–15.4)
RPR Ser Ql: NONREACTIVE
WBC: 6.1 10*3/uL (ref 3.4–10.8)

## 2023-09-27 ENCOUNTER — Ambulatory Visit (INDEPENDENT_AMBULATORY_CARE_PROVIDER_SITE_OTHER): Payer: Medicaid Other | Admitting: Obstetrics

## 2023-09-27 VITALS — BP 102/68 | HR 73 | Wt 178.0 lb

## 2023-09-27 DIAGNOSIS — Z3483 Encounter for supervision of other normal pregnancy, third trimester: Secondary | ICD-10-CM

## 2023-09-27 NOTE — Progress Notes (Signed)
    Return Prenatal Note   Subjective  32 y.o. [id number] at [redacted]w[redacted]d presents for this follow-up prenatal visit.  Patient is well today, no acute concerns. Has been considering contraception options, used Depo and Nexplanon in the past. Didn't really like either due to the irregular bleeding. Is unsure about future children, not closing door after this delivery.   Patient reports: Movement: Present Contractions: Not present Denies vaginal bleeding or leaking fluid. Objective  Flow sheet Vitals: Pulse Rate: 73 BP: 102/68 Fundal Height: 30 cm Fetal Heart Rate (bpm): 139 Total weight gain: 13 lb (5.897 kg)  General Appearance  No acute distress, well appearing, and well nourished Pulmonary   Normal work of breathing Neurologic   Alert and oriented to person, place, and time Psychiatric   Mood and affect within normal limits  Assessment/Plan   Plan  32 y.o. [id number] at [redacted]w[redacted]d by LMP=10wk [place] presents for follow-up OB visit. Reviewed prenatal record including previous visit note.  1. Encounter for supervision of other normal pregnancy, third trimester -Reviewed ideal interpregnancy interval of 24mos.  -Contraception options reviewed: LARCs, OCPs, Patch, Ring, Depo, pospartum BTL. Handout provided.  -Pt considering IUD, but still unsure.  -Also discussed RSV vaccine for next visit and pt desires.   Future Appointments  Date Time Provider Department Center  10/11/2023  8:55 AM Dominica Severin, CNM [facility] None  10/26/2023  8:55 AM Doreene Burke, CNM [facility] None   For next visit: continue routine prenatal care,  RSV vaccine    Julieanne Manson, DO [facility]

## 2023-10-11 ENCOUNTER — Observation Stay: Payer: Medicaid Other

## 2023-10-11 ENCOUNTER — Telehealth: Payer: Self-pay

## 2023-10-11 ENCOUNTER — Other Ambulatory Visit: Payer: Self-pay | Admitting: Obstetrics

## 2023-10-11 ENCOUNTER — Observation Stay
Admission: EM | Admit: 2023-10-11 | Discharge: 2023-10-11 | Disposition: A | Discharge status: Home / Self Care | Payer: Medicaid Other | Attending: Obstetrics and Gynecology | Admitting: Obstetrics

## 2023-10-11 ENCOUNTER — Encounter: Payer: Self-pay | Admitting: Obstetrics and Gynecology

## 2023-10-11 ENCOUNTER — Other Ambulatory Visit: Payer: Self-pay

## 2023-10-11 ENCOUNTER — Ambulatory Visit (INDEPENDENT_AMBULATORY_CARE_PROVIDER_SITE_OTHER): Payer: Medicaid Other | Admitting: Certified Nurse Midwife

## 2023-10-11 VITALS — BP 132/86 | HR 97 | Wt 178.1 lb

## 2023-10-11 DIAGNOSIS — O368130 Decreased fetal movements, third trimester, not applicable or unspecified: Secondary | ICD-10-CM | POA: Diagnosis present

## 2023-10-11 DIAGNOSIS — O26899 Other specified pregnancy related conditions, unspecified trimester: Secondary | ICD-10-CM | POA: Diagnosis present

## 2023-10-11 DIAGNOSIS — Z3A32 32 weeks gestation of pregnancy: Secondary | ICD-10-CM

## 2023-10-11 DIAGNOSIS — Z23 Encounter for immunization: Secondary | ICD-10-CM | POA: Diagnosis not present

## 2023-10-11 DIAGNOSIS — R1011 Right upper quadrant pain: Secondary | ICD-10-CM | POA: Insufficient documentation

## 2023-10-11 DIAGNOSIS — R109 Unspecified abdominal pain: Secondary | ICD-10-CM

## 2023-10-11 DIAGNOSIS — Z79899 Other long term (current) drug therapy: Secondary | ICD-10-CM | POA: Insufficient documentation

## 2023-10-11 DIAGNOSIS — Z3483 Encounter for supervision of other normal pregnancy, third trimester: Secondary | ICD-10-CM

## 2023-10-11 DIAGNOSIS — O26893 Other specified pregnancy related conditions, third trimester: Secondary | ICD-10-CM

## 2023-10-11 DIAGNOSIS — G43809 Other migraine, not intractable, without status migrainosus: Secondary | ICD-10-CM

## 2023-10-11 LAB — CBC
HCT: 31.3 % — ABNORMAL LOW (ref 36.0–46.0)
Hemoglobin: 11.1 g/dL — ABNORMAL LOW (ref 12.0–15.0)
MCH: 32.8 pg (ref 26.0–34.0)
MCHC: 35.5 g/dL (ref 30.0–36.0)
MCV: 92.6 fL (ref 80.0–100.0)
Platelets: 166 10*3/uL (ref 150–400)
RBC: 3.38 MIL/uL — ABNORMAL LOW (ref 3.87–5.11)
RDW: 13.5 % (ref 11.5–15.5)
WBC: 8.3 10*3/uL (ref 4.0–10.5)
nRBC: 0 % (ref 0.0–0.2)

## 2023-10-11 LAB — COMPREHENSIVE METABOLIC PANEL
ALT: 11 U/L (ref 0–44)
AST: 16 U/L (ref 15–41)
Albumin: 2.9 g/dL — ABNORMAL LOW (ref 3.5–5.0)
Alkaline Phosphatase: 57 U/L (ref 38–126)
Anion gap: 10 (ref 5–15)
BUN: 7 mg/dL (ref 6–20)
CO2: 21 mmol/L — ABNORMAL LOW (ref 22–32)
Calcium: 9 mg/dL (ref 8.9–10.3)
Chloride: 104 mmol/L (ref 98–111)
Creatinine, Ser: 0.44 mg/dL (ref 0.44–1.00)
GFR, Estimated: >60 mL/min (ref >60)
Glucose, Bld: 109 mg/dL — ABNORMAL HIGH (ref 70–99)
Potassium: 3.4 mmol/L — ABNORMAL LOW (ref 3.5–5.1)
Sodium: 135 mmol/L (ref 135–145)
Total Bilirubin: 0.3 mg/dL (ref <1.2)
Total Protein: 5.8 g/dL — ABNORMAL LOW (ref 6.5–8.1)

## 2023-10-11 LAB — POCT URINALYSIS DIPSTICK
Bilirubin, UA: NEGATIVE
Blood, UA: NEGATIVE
Glucose, UA: NEGATIVE
Ketones, UA: NEGATIVE
Leukocytes, UA: NEGATIVE
Nitrite, UA: NEGATIVE
Protein, UA: NEGATIVE
Spec Grav, UA: 1.02 (ref 1.010–1.025)
Urobilinogen, UA: 0.2 U/dL
pH, UA: 7 (ref 5.0–8.0)

## 2023-10-11 LAB — URINALYSIS, COMPLETE (UACMP) WITH MICROSCOPIC
Bacteria, UA: NONE SEEN
Bilirubin Urine: NEGATIVE
Glucose, UA: NEGATIVE mg/dL
Hgb urine dipstick: NEGATIVE
Ketones, ur: NEGATIVE mg/dL
Leukocytes,Ua: NEGATIVE
Nitrite: NEGATIVE
Protein, ur: NEGATIVE mg/dL
Specific Gravity, Urine: 1.012 (ref 1.005–1.030)
pH: 7 (ref 5.0–8.0)

## 2023-10-11 LAB — AMYLASE: Amylase: 27 U/L — ABNORMAL LOW (ref 28–100)

## 2023-10-11 LAB — PROTEIN / CREATININE RATIO, URINE
Creatinine, Urine: 97 mg/dL
Protein Creatinine Ratio: 0.09 mg/mg{creat} (ref 0.00–0.15)
Total Protein, Urine: 9 mg/dL

## 2023-10-11 LAB — LIPASE, BLOOD: Lipase: 25 U/L (ref 11–51)

## 2023-10-11 MED ORDER — CYCLOBENZAPRINE HCL 5 MG PO TABS
5.0000 mg | ORAL_TABLET | Freq: Once | ORAL | Status: AC
  Administered 2023-10-11: 5 mg via ORAL
  Filled 2023-10-11: qty 1

## 2023-10-11 MED ORDER — ACETAMINOPHEN 500 MG PO TABS
1000.0000 mg | ORAL_TABLET | Freq: Four times a day (QID) | ORAL | Status: DC | PRN
  Administered 2023-10-11: 1000 mg via ORAL
  Filled 2023-10-11: qty 2

## 2023-10-11 NOTE — OB Triage Note (Signed)
Patient is a 32 yo, G3P2, at 32 weeks 2 days. Patient presents with complaints of RUQ pain. Patient was sent over from the [facility] office for a PIH workup d/t decreased fetal movement and RUQ pain. Patient rates the pain 7/10 on the pain scale.  Patient denies any vaginal bleeding or LOF. Patient reports that she has not felt her baby move yet today. Patient does note occasional floaters. Reflexes +2, clonus absent. Monitors applied and assessing. VSS. Initial fetal heart tone . Eunice Blase CNM in dept and notified of patients arrival to unit. Plan to place in observation for NST and PIH workup.

## 2023-10-11 NOTE — Patient Instructions (Signed)

## 2023-10-11 NOTE — Progress Notes (Signed)
Pt transported to US via transporter.

## 2023-10-11 NOTE — H&P (Signed)
Melinda Evans is a 32 y.o. female sent from [facility] office with complaints of RUQ abdominal pain and "seeing spots." She began noticing this pain about a week ago, but it has intensified over the past 2 days. She reports 6/10 constant pain that is felt under her right rib, and wraps around to her mid back. It intensified after eating crackers and cheese last night, but she has found her appetite to be diminished since the pain began intensifying 2 days ago. She also reports having 3 bowel movements a day over the past couple of days, but has not had diarrhea. She mentions a history of migraines, and mentions that her visual changes are similar to what she has noticed prior to having a migraine, but she does not complain of a headache at this time. Additionally, she reports decreased fetal movement, but is now feeling an increase in movements after being in the observation room.  She has had little to eat since yesterday. She mentions some urinary urgency (reports history of this) but denies dysuria. She shared a past surgical history of laparoscopic cholecystectomy.     OB History     Gravida  3   Para  2   Term  2   Preterm      AB      Living  2      SAB      IAB      Ectopic      Multiple  0   Live Births  2          Past Medical History:  Diagnosis Date   Bladder spasms    Gall stones    GERD (gastroesophageal reflux disease)    Headache(784.0)    UTI (lower urinary tract infection)    Past Surgical History:  Procedure Laterality Date   TONSILLECTOMY     WISDOM TOOTH EXTRACTION     Family History: family history includes Diabetes in her maternal grandmother, mother, and paternal grandfather; Healthy in her father, paternal grandmother, and sister; Heart disease in her maternal grandfather; Hypertension in her maternal grandmother and paternal aunt. Social History:  reports that she has never smoked. She has never used smokeless tobacco. She reports that she does  not drink alcohol and does not use drugs.     Maternal Diabetes: No Genetic Screening: Normal Maternal Ultrasounds/Referrals: Normal Fetal Ultrasounds or other Referrals:  None Maternal Substance Abuse:  No Significant Maternal Medications:  Meds include: Other: escitalopram 10mg  daily Significant Maternal Lab Results:  None Number of Prenatal Visits:greater than 3 verified prenatal visits Maternal Vaccinations:RSV: Given during pregnancy <14 days ago Other Comments:  None  Review of Systems  Constitutional:  Positive for appetite change. Negative for fever.  Eyes:  Positive for photophobia and visual disturbance.  Respiratory: Negative.    Cardiovascular: Negative.   Gastrointestinal:  Positive for abdominal pain.       Gravid abdomen  Endocrine: Negative.   Genitourinary:  Positive for urgency.  Musculoskeletal: Negative.   Skin: Negative.   Neurological: Negative.   Hematological: Negative.   Psychiatric/Behavioral: Negative.          Reports frequent bowel movements over the past few days  History   Blood pressure 118/75, pulse 97, temperature 98.4 F (36.9 C), temperature source Oral, resp. rate 16, height 5\' 2"  (1.575 m), weight 80.7 kg, last menstrual period 02/27/2023, unknown if currently breastfeeding. Maternal Exam:  Abdomen: Patient reports the following abdominal tenderness: RUQ.    Physical  Exam Cardiovascular:     Rate and Rhythm: Normal rate and regular rhythm.  Pulmonary:     Effort: Pulmonary effort is normal.  Abdominal:     General: Bowel sounds are normal.     Palpations: Abdomen is soft.     Tenderness: There is abdominal tenderness in the right upper quadrant. There is right CVA tenderness.     Comments: Gravid abdomen  Genitourinary:    Comments: deferred Skin:    General: Skin is warm and dry.  Neurological:     General: No focal deficit present.     Mental Status: She is alert.  Psychiatric:        Mood and Affect: Mood normal.         Behavior: Behavior normal.     Prenatal labs: ABO, Rh: O/Positive/-- (06/07 1610) Antibody: Negative (06/07 0914) Rubella: 2.67 (06/07 0914) RPR: Non Reactive (10/10 0957)  HBsAg: Negative (06/07 0914)  HIV: Non Reactive (10/10 0957)  GBS:     Assessment/Plan: 32 year old G3 P2 at 32.[redacted] weeks gestation with RUQ pain that wraps around to her back x2 days, and CVA tenderness, as well as reported visual changes.  -Previous history of cholecystectomy  -FHR 130 baseline, moderate variability, 15x15 accels present, decels absent   Plan:  -HELLP labs, urine protein/creatinine ratio, amylase and lipase to r/o bile duct stones.  -Renal ultrasound and UA ordered  -Flexeril 5mg  PO, tylenol 1g PO  -Will review labs and ultrasound results, then develop POC  Cindra Eves, SNM 10/11/2023 11:15am  Mirna Mires 10/11/2023, 11:15 AM

## 2023-10-11 NOTE — Telephone Encounter (Signed)
Per [facility], called pt's pharmacy to check on Rx Butalbital-APAP-Caffeine 50-325-40 MG capsule. Spoke to [place] at pharmacy and was advised Rx never picked up, they had filled it and put it away after 10 days. I asked if they can fill it for pt, he ran it first before he could say yes. Rx went through with $0 copay. Should be ready for pick up after 4 pm. Called pt to let her know, no answer, LVMTRC. Sending her mychart msg also.

## 2023-10-11 NOTE — Discharge Summary (Signed)
Discharge summary /Final Progress Note  Patient ID: Melinda Evans MRN: [id number] DOB/AGE: 1991/07/22 32 y.o.  Admit date: 10/11/2023 Admitting provider: Mirna Mires, CNM Discharge date: 10/11/2023   Admission Diagnoses:  Decreased fetal movement at [redacted] weeks gestation Visual changes in pregnancy Abdominal pain affecting pregnancy  Discharge Diagnoses:  Principal Problem:   Abdominal pain affecting pregnancy  Reactive fetal heart tones Normotensive blood pressure  History of Present Illness: The patient is a 32 y.o. female G3P2002 at [redacted]w[redacted]d who presents for  a PIH work up from the AOB office today, where she c/o seeing intermittent "floaters" and also reported decreased fetal movement.Marland Kitchen Upon her arrival to the labor and delivery unit, she also c/o some epigastric pain that extends around her right side.She admitted to having little to eat since last evening. She denies any contractions, vaginal bleeding or LOF,  Her pregnancy is noted for Hx of migraines. She was prescribed Bupap last month for headaches and migraine hx, but never filled the Rx.   Past Medical History:  Diagnosis Date   Bladder spasms    Gall stones    GERD (gastroesophageal reflux disease)    Headache(784.0)    UTI (lower urinary tract infection)     Past Surgical History:  Procedure Laterality Date   TONSILLECTOMY     WISDOM TOOTH EXTRACTION      No current facility-administered medications on file prior to encounter.   Current Outpatient Medications on File Prior to Encounter  Medication Sig Dispense Refill   escitalopram (LEXAPRO) 10 MG tablet Take 1 tablet (10 mg total) by mouth daily. 90 tablet 3   prenatal vitamin w/FE, FA (NATACHEW) 29-1 MG CHEW chewable tablet Chew 2 tablets by mouth daily at 12 noon.     Butalbital-APAP-Caffeine 50-325-40 MG capsule Take 1-2 capsules by mouth every 6 (six) hours as needed for headache. 30 capsule 1    No Known Allergies  Social History    Socioeconomic History   Marital status: Married    Spouse name: Nurse, children's   Number of children: 2   Years of education: 12   Highest education level: Not on file  Occupational History   Occupation: Print production planner  Tobacco Use   Smoking status: Never   Smokeless tobacco: Never  Vaping Use   Vaping status: Never Used  Substance and Sexual Activity   Alcohol use: No   Drug use: No   Sexual activity: Yes    Partners: Male    Birth control/protection: None  Other Topics Concern   Not on file  Social History Narrative   Not on file   Social Determinants of Health   Financial Resource Strain: Low Risk  (04/20/2023)   Overall Financial Resource Strain (CARDIA)    Difficulty of Paying Living Expenses: Not very hard  Food Insecurity: No Food Insecurity (04/20/2023)   Hunger Vital Sign    Worried About Running Out of Food in the Last Year: Never true    Ran Out of Food in the Last Year: Never true  Transportation Needs: No Transportation Needs (04/20/2023)   PRAPARE - Administrator, Civil Service (Medical): No    Lack of Transportation (Non-Medical): No  Physical Activity: Sufficiently Active (04/20/2023)   Exercise Vital Sign    Days of Exercise per Week: 7 days    Minutes of Exercise per Session: 30 min  Stress: No Stress Concern Present (04/20/2023)   [employer] of Occupational Health - Occupational Stress Questionnaire  Feeling of Stress : Not at all  Social Connections: Moderately Integrated (04/20/2023)   Social Connection and Isolation Panel [NHANES]    Frequency of Communication with Friends and Family: More than three times a week    Frequency of Social Gatherings with Friends and Family: Once a week    Attends Religious Services: More than 4 times per year    Active Member of [employer] or Organizations: No    Attends Banker Meetings: Never    Marital Status: Married  Catering manager Violence: Not At Risk (04/20/2023)   Humiliation,  Afraid, Rape, and Kick questionnaire    Fear of Current or Ex-Partner: No    Emotionally Abused: No    Physically Abused: No    Sexually Abused: No    Family History  Problem Relation Age of Onset   Diabetes Mother        gesttional   Healthy Father    Healthy Sister    Diabetes Maternal Grandmother    Hypertension Maternal Grandmother    Heart disease Maternal Grandfather    Healthy Paternal Grandmother    Diabetes Paternal Grandfather        pre diabetic   Hypertension Paternal Aunt      Review of Systems  Constitutional: Negative.   HENT: Negative.    Eyes:        Intermittent "floaters" noted Hx of migraines  Respiratory: Negative.    Cardiovascular: Negative.   Gastrointestinal:  Positive for abdominal pain.  Genitourinary:  Positive for flank pain and urgency.       C/o pain that radiates from her epigastric area around to her right CVA.  Skin: Negative.   Neurological: Negative.   Endo/Heme/Allergies: Negative.   Psychiatric/Behavioral: Negative.       Physical Exam: BP 118/75 (BP Location: Left Arm)   Pulse 97   Temp 98.4 F (36.9 C) (Oral)   Resp 16   Ht 5\' 2"  (1.575 m)   Wt 80.7 kg   LMP 02/27/2023 (Exact Date)   BMI 32.56 kg/m   Physical Exam Constitutional:      Appearance: Normal appearance.  Genitourinary:     Genitourinary Comments: Exam deferred  HENT:     Head: Normocephalic and atraumatic.  Eyes:     Pupils: Pupils are equal, round, and reactive to light.  Cardiovascular:     Rate and Rhythm: Normal rate and regular rhythm.     Pulses: Normal pulses.     Heart sounds: Normal heart sounds.  Pulmonary:     Effort: Pulmonary effort is normal.     Breath sounds: Normal breath sounds.  Abdominal:     Comments: Gravid abdomen Soft no contractions palpated nor traced per EFM.  Musculoskeletal:        General: Normal range of motion.  Neurological:     General: No focal deficit present.     Mental Status: She is alert and oriented to  person, place, and time.  Skin:    General: Skin is warm and dry.  Psychiatric:        Mood and Affect: Mood normal.        Behavior: Behavior normal.  Vitals and nursing note reviewed.     Consults: None  Significant Findings/ Diagnostic Studies: labs: Results for orders placed or performed during the hospital encounter of 10/11/23 (from the past 24 hour(s))  CBC     Status: Abnormal   Collection Time: 10/11/23 10:24 AM  Result Value Ref Range  WBC 8.3 4.0 - 10.5 K/uL   RBC 3.38 (L) 3.87 - 5.11 MIL/uL   Hemoglobin 11.1 (L) 12.0 - 15.0 g/dL   HCT 84.6 (L) 96.2 - 95.2 %   MCV 92.6 80.0 - 100.0 fL   MCH 32.8 26.0 - 34.0 pg   MCHC 35.5 30.0 - 36.0 g/dL   RDW 84.1 32.4 - 40.1 %   Platelets 166 150 - 400 K/uL   nRBC 0.0 0.0 - 0.2 %  Comprehensive metabolic panel     Status: Abnormal   Collection Time: 10/11/23 10:24 AM  Result Value Ref Range   Sodium 135 135 - 145 mmol/L   Potassium 3.4 (L) 3.5 - 5.1 mmol/L   Chloride 104 98 - 111 mmol/L   CO2 21 (L) 22 - 32 mmol/L   Glucose, Bld 109 (H) 70 - 99 mg/dL   BUN 7 6 - 20 mg/dL   Creatinine, Ser 0.27 0.44 - 1.00 mg/dL   Calcium 9.0 8.9 - 25.3 mg/dL   Total Protein 5.8 (L) 6.5 - 8.1 g/dL   Albumin 2.9 (L) 3.5 - 5.0 g/dL   AST 16 15 - 41 U/L   ALT 11 0 - 44 U/L   Alkaline Phosphatase 57 38 - 126 U/L   Total Bilirubin 0.3 <1.2 mg/dL   GFR, Estimated >66 >44 mL/min   Anion gap 10 5 - 15  Protein / creatinine ratio, urine     Status: None   Collection Time: 10/11/23 10:24 AM  Result Value Ref Range   Creatinine, Urine 97 mg/dL   Total Protein, Urine 9 mg/dL   Protein Creatinine Ratio 0.09 0.00 - 0.15 mg/mg[Cre]  Urinalysis, Complete w Microscopic -Urine, Clean Catch     Status: Abnormal   Collection Time: 10/11/23 10:24 AM  Result Value Ref Range   Color, Urine YELLOW (A) YELLOW   APPearance HAZY (A) CLEAR   Specific Gravity, Urine 1.012 1.005 - 1.030   pH 7.0 5.0 - 8.0   Glucose, UA NEGATIVE NEGATIVE mg/dL   Hgb urine  dipstick NEGATIVE NEGATIVE   Bilirubin Urine NEGATIVE NEGATIVE   Ketones, ur NEGATIVE NEGATIVE mg/dL   Protein, ur NEGATIVE NEGATIVE mg/dL   Nitrite NEGATIVE NEGATIVE   Leukocytes,Ua NEGATIVE NEGATIVE   RBC / HPF 6-10 0 - 5 RBC/hpf   WBC, UA 0-5 0 - 5 WBC/hpf   Bacteria, UA NONE SEEN NONE SEEN   Squamous Epithelial / HPF 0-5 0 - 5 /HPF   Mucus PRESENT    Hyphae Yeast PRESENT    Hyaline Casts, UA PRESENT   Amylase     Status: Abnormal   Collection Time: 10/11/23 10:24 AM  Result Value Ref Range   Amylase 27 (L) 28 - 100 U/L  Lipase, blood     Status: None   Collection Time: 10/11/23 10:24 AM  Result Value Ref Range   Lipase 25 11 - 51 U/L     Procedures: EFM NST Baseline FHR: normal  baseline  Variability: moderate Accelerations: present Decelerations: absent Tocometry: no Ucs noted  Interpretation:  INDICATIONS: decreased fetal movement and patient reassurance RESULTS:  A NST procedure was performed with FHR monitoring and a normal baseline established, appropriate time of 20-40 minutes of evaluation, and accels >2 seen w 15x15 characteristics.  Results show a REACTIVE NST.  Ultrasound report  or the right CVA for right CVAT: no hydronephrosis. No kidney enlargement.  Hospital Course: The patient was admitted to Labor and Delivery Triage for observation. Fetal heart tones  were monitored. No uterine activity noted. The patient was offered oral hydration and food, and afterwards reported good fetal movement. Labs were ordered: a PIH panel, UA, maylase and Lipase, and all resulted in normal range results. The NST was reactive. The patient was provided Tylenol and Flexeril for pain. A renal ultrasound was ordered to evaluate the right CVA tenderness and this also showed no hydronephrosis or evidence of stones. With all reassuring findings, and reactive tracing, Ms. Rigel was provided a prescription for limited Bupap and is discharged home with plans to f/u next week PRN if not  imporved, or at her next scheduled ROB.  Discharge Condition: good  Disposition: Discharge disposition: 01-Home or Self Care       Diet: Regular diet  Discharge Activity: Activity as tolerated  Discharge Instructions     Notify physician for a general feeling that "something is not right"   Complete by: As directed    Notify physician for increase or change in vaginal discharge   Complete by: As directed    Notify physician for intestinal cramps, with or without diarrhea, sometimes described as "gas pain"   Complete by: As directed    Notify physician for leaking of fluid   Complete by: As directed    Notify physician for low, dull backache, unrelieved by heat or Tylenol   Complete by: As directed    Notify physician for menstrual like cramps   Complete by: As directed    Notify physician for pelvic pressure   Complete by: As directed    Notify physician for uterine contractions.  These may be painless and feel like the uterus is tightening or the baby is  "balling up"   Complete by: As directed    Notify physician for vaginal bleeding   Complete by: As directed    PRETERM LABOR:  Includes any of the follwing symptoms that occur between 20 - [redacted] weeks gestation.  If these symptoms are not stopped, preterm labor can result in preterm delivery, placing your baby at risk   Complete by: As directed       Allergies as of 10/11/2023   No Known Allergies      Medication List     TAKE these medications    Butalbital-APAP-Caffeine 50-325-40 MG capsule Take 1-2 capsules by mouth every 6 (six) hours as needed for headache.   escitalopram 10 MG tablet Commonly known as: LEXAPRO Take 1 tablet (10 mg total) by mouth daily.   prenatal vitamin w/FE, FA 29-1 MG Chew chewable tablet Chew 2 tablets by mouth daily at 12 noon.         Total time spent taking care of this patient: 50 minutes  Signed: Mirna Mires, CNM  10/11/2023, 1:50 PM

## 2023-10-11 NOTE — Progress Notes (Signed)
    Return Prenatal Note   Subjective   32 y.o. [id number] at [redacted]w[redacted]d presents for this follow-up prenatal visit.  Patient reports right sided abdominal pain, constant ache that is keeping her awake. Tylenol has not improved. Also reports not feeling baby move as much yesterday or today, she felt movement last evening. Patient reports: Movement: Present Contractions: Irritability  Objective   Flow sheet Vitals: Pulse Rate: 97 BP: 132/86 Fetal Heart Rate (bpm): 140 Total weight gain: 13 lb (5.897 kg)  General Appearance  No acute distress, well appearing, and well nourished Pulmonary   Normal work of breathing Neurologic   Alert and oriented to person, place, and time Psychiatric   Mood and affect within normal limits  Assessment/Plan   Plan  32 y.o. [id number] at [redacted]w[redacted]d presents for follow-up OB visit. Reviewed prenatal record including previous visit note.  1. Encounter for supervision of other normal pregnancy, third trimester - Respiratory syncytial virus vaccine, preF, subunit, bivalent,(Abrysvo) - POCT Urinalysis Dipstick  2. [redacted] weeks gestation of pregnancy - Respiratory syncytial virus vaccine, preF, subunit, bivalent,(Abrysvo) - POCT Urinalysis Dipstick  3. Encounter for immunization - Respiratory syncytial virus vaccine, preF, subunit, bivalent,(Abrysvo)  4. Abdominal pain affecting pregnancy   To L&D for pre-eclampsia evaluation given persistent RUQ pain despite comfort measures & tylenol as well as decreased fetal movement. M. Eunice Blase, CNM & L&D staff notified.  Orders Placed This Encounter  Procedures   Respiratory syncytial virus vaccine, preF, subunit, bivalent,(Abrysvo)   POCT Urinalysis Dipstick   Return in 2 weeks (on 10/25/2023) for ROB.   Future Appointments  Date Time Provider Department Center  10/26/2023  8:55 AM Doreene Burke, CNM [facility] None    For next visit:  continue with routine prenatal care     Dominica Severin, CNM  10/10/2410:07  AM

## 2023-10-11 NOTE — OB Triage Note (Signed)
Discharge instructions, labor precautions, and follow-up care reviewed with patient. All questions answered. Patient verbalized understanding. Discharged ambulatory off unit.   

## 2023-10-26 ENCOUNTER — Encounter: Payer: Self-pay | Admitting: Certified Nurse Midwife

## 2023-10-26 ENCOUNTER — Ambulatory Visit (INDEPENDENT_AMBULATORY_CARE_PROVIDER_SITE_OTHER): Payer: Medicaid Other | Admitting: Certified Nurse Midwife

## 2023-10-26 VITALS — BP 120/84 | HR 85 | Wt 175.3 lb

## 2023-10-26 DIAGNOSIS — Z3A34 34 weeks gestation of pregnancy: Secondary | ICD-10-CM

## 2023-10-26 DIAGNOSIS — O2613 Low weight gain in pregnancy, third trimester: Secondary | ICD-10-CM

## 2023-10-26 LAB — POCT URINALYSIS DIPSTICK OB
Bilirubin, UA: NEGATIVE
Blood, UA: NEGATIVE
Glucose, UA: NEGATIVE
Ketones, UA: NEGATIVE
Leukocytes, UA: NEGATIVE
Nitrite, UA: NEGATIVE
POC,PROTEIN,UA: NEGATIVE
Spec Grav, UA: 1.01 (ref 1.010–1.025)
Urobilinogen, UA: 0.2 U/dL
pH, UA: 6.5 (ref 5.0–8.0)

## 2023-10-26 NOTE — Patient Instructions (Signed)

## 2023-10-26 NOTE — Progress Notes (Signed)
ROB doing well, feeling good movement. Pt expresses concern about total weight gain 10 lbs. She state she is worried about size of baby. Feels like she has been eating fine. Fundus measures appropriately. Will do growth u/s . Orders placed. Discussed kick counts , fetal movement. Discussed GBS testing next visit. Follow up 2 wks for ROB.   Doreene Burke, CNM

## 2023-11-09 ENCOUNTER — Ambulatory Visit (INDEPENDENT_AMBULATORY_CARE_PROVIDER_SITE_OTHER): Payer: Medicaid Other

## 2023-11-09 ENCOUNTER — Other Ambulatory Visit (HOSPITAL_COMMUNITY)
Admission: RE | Admit: 2023-11-09 | Discharge: 2023-11-09 | Disposition: A | Discharge status: Home / Self Care | Payer: Medicaid Other | Source: Ambulatory Visit

## 2023-11-09 VITALS — BP 119/81 | HR 79 | Wt 182.0 lb

## 2023-11-09 DIAGNOSIS — Z113 Encounter for screening for infections with a predominantly sexual mode of transmission: Secondary | ICD-10-CM

## 2023-11-09 DIAGNOSIS — Z3A36 36 weeks gestation of pregnancy: Secondary | ICD-10-CM

## 2023-11-09 DIAGNOSIS — Z3685 Encounter for antenatal screening for Streptococcus B: Secondary | ICD-10-CM | POA: Insufficient documentation

## 2023-11-09 DIAGNOSIS — Z3483 Encounter for supervision of other normal pregnancy, third trimester: Secondary | ICD-10-CM | POA: Insufficient documentation

## 2023-11-09 NOTE — Progress Notes (Signed)
    Return Prenatal Note   Assessment/Plan   Plan  32 y.o. [id number] at [redacted]w[redacted]d presents for follow-up OB visit. Reviewed prenatal record including previous visit note.  Encounter for supervision of other normal pregnancy, third trimester - GBS and GC/CT screening swabs self-collected today. - Has growth [place] scheduled for next week due to previous concerns over low weight gain. Continues to measure on target. - Provided with resources for encouraging timely labor. Reviewed options for hydrotherapy at [facility]. - Reviewed labor warning signs and expectations for birth. Instructed to call office or come to hospital with persistent headache, vision changes, regular contractions, leaking of fluid, decreased fetal movement or vaginal bleeding.   Orders Placed This Encounter  Procedures   Strep Gp B NAA   Return in about 1 week (around 11/16/2023) for ROB.   Future Appointments  Date Time Provider Department Center  11/14/2023 10:15 AM [facility] [place] 1 [facility] None    For next visit:  continue with routine prenatal care     Subjective   32 y.o. [id number] at [redacted]w[redacted]d presents for this follow-up prenatal visit.  Patient has no concerns.  Patient reports: Movement: Present Contractions: Not present  Objective   Flow sheet Vitals: Pulse Rate: 79 BP: 119/81 Fundal Height: 36 cm Fetal Heart Rate (bpm): 145 Presentation: Vertex Total weight gain: 17 lb (7.711 kg)  General Appearance  No acute distress, well appearing, and well nourished Pulmonary   Normal work of breathing Neurologic   Alert and oriented to person, place, and time Psychiatric   Mood and affect within normal limits  Marleen Bringhurst Sirinity Outland, CNM  12/06/248:49 AM

## 2023-11-09 NOTE — Assessment & Plan Note (Addendum)
-   GBS and GC/CT screening swabs self-collected today. - Has growth [place] scheduled for next week due to previous concerns over low weight gain. Continues to measure on target. - Provided with resources for encouraging timely labor. Reviewed options for hydrotherapy at [facility]. - Reviewed labor warning signs and expectations for birth. Instructed to call office or come to hospital with persistent headache, vision changes, regular contractions, leaking of fluid, decreased fetal movement or vaginal bleeding.

## 2023-11-11 LAB — STREP GP B NAA: Strep Gp B NAA: POSITIVE — AB

## 2023-11-12 DIAGNOSIS — B951 Streptococcus, group B, as the cause of diseases classified elsewhere: Secondary | ICD-10-CM

## 2023-11-12 HISTORY — DX: Streptococcus, group b, as the cause of diseases classified elsewhere: B95.1

## 2023-11-12 LAB — CERVICOVAGINAL ANCILLARY ONLY
Chlamydia: NEGATIVE
Comment: NEGATIVE
Comment: NORMAL
Neisseria Gonorrhea: NEGATIVE

## 2023-11-14 ENCOUNTER — Observation Stay
Admission: EM | Admit: 2023-11-14 | Discharge: 2023-11-15 | Disposition: A | Discharge status: Home / Self Care | Payer: Medicaid Other | Attending: Obstetrics and Gynecology | Admitting: Obstetrics and Gynecology

## 2023-11-14 ENCOUNTER — Encounter: Payer: Self-pay | Admitting: Obstetrics

## 2023-11-14 ENCOUNTER — Encounter: Payer: Self-pay | Admitting: Obstetrics and Gynecology

## 2023-11-14 ENCOUNTER — Ambulatory Visit (INDEPENDENT_AMBULATORY_CARE_PROVIDER_SITE_OTHER): Payer: Medicaid Other | Admitting: Obstetrics

## 2023-11-14 ENCOUNTER — Ambulatory Visit: Payer: Medicaid Other

## 2023-11-14 ENCOUNTER — Other Ambulatory Visit: Payer: Self-pay

## 2023-11-14 VITALS — BP 120/80 | HR 93 | Wt 181.0 lb

## 2023-11-14 DIAGNOSIS — Z3A35 35 weeks gestation of pregnancy: Secondary | ICD-10-CM

## 2023-11-14 DIAGNOSIS — Z3A37 37 weeks gestation of pregnancy: Secondary | ICD-10-CM | POA: Diagnosis not present

## 2023-11-14 DIAGNOSIS — O2613 Low weight gain in pregnancy, third trimester: Secondary | ICD-10-CM | POA: Diagnosis not present

## 2023-11-14 DIAGNOSIS — O479 False labor, unspecified: Principal | ICD-10-CM | POA: Diagnosis present

## 2023-11-14 DIAGNOSIS — B951 Streptococcus, group B, as the cause of diseases classified elsewhere: Secondary | ICD-10-CM

## 2023-11-14 DIAGNOSIS — O471 False labor at or after 37 completed weeks of gestation: Secondary | ICD-10-CM | POA: Diagnosis present

## 2023-11-14 DIAGNOSIS — Z3A34 34 weeks gestation of pregnancy: Secondary | ICD-10-CM

## 2023-11-14 DIAGNOSIS — Z3483 Encounter for supervision of other normal pregnancy, third trimester: Secondary | ICD-10-CM

## 2023-11-14 NOTE — Progress Notes (Signed)
    Return Prenatal Note   Subjective  32 y.o. [id number] at [redacted]w[redacted]d presents for this follow-up prenatal visit. Pregnancy notable for GBS positive and anxiety, taking Lexapro 10mg .   Patient had [place] today for growth, was concerned about low weight gain. Final read not completed, but prelim shows a cephalic fetus, AFI 13.3, EFW 4540J (6#0), 23%ile, AC 31%ile. Also concerned about distance from home to hospital, takes her a solid hour to drive here. Is nervous about going into labor and not having enough time to get the antibiotics for GBS. Her last delivery from time of arrival at hospital to actual delivery was 3hrs.   Patient reports: Movement: Present Contractions: Irritability Denies vaginal bleeding or leaking fluid. Objective  Flow sheet Vitals: Pulse Rate: 93 BP: 120/80 Total weight gain: 16 lb (7.258 kg)  General Appearance  No acute distress, well appearing, and well nourished Pulmonary   Normal work of breathing Neurologic   Alert and oriented to person, place, and time Psychiatric   Mood and affect within normal limits  Assessment/Plan   Plan  32 y.o. [id number] at [redacted]w[redacted]d by LMP=10wk [place] presents for follow-up OB visit. Reviewed prenatal record including previous visit note. 1. Encounter for supervision of other normal pregnancy, third trimester 2. Positive GBS test -We discussed the recommended dosing of PCN for GBS prophylaxis every 4hrs while in labor and discussed that adequate treatment is when she has received at least 2 doses prior to birth of infant. This made patient worried and she understands that the risk of infant contracting GBS-related illness is overall low, but wants to take every precaution she can to ensure she's adequately treated. Due to pt's history of fast labor, and distance she lives from hospital, offered IOL at 39+wks. She would like to discuss further with her husband and would like to revisit this at her next prenatal visit. Labor precautions reviewed.    Return in about 1 week (around 11/21/2023) for rob.   Future Appointments  Date Time Provider Department Center  11/21/2023 10:55 AM Tresea Mall, CNM [facility] None   For next visit:  continue with routine prenatal care   Julieanne Manson, DO  [facility]

## 2023-11-15 DIAGNOSIS — Z3A37 37 weeks gestation of pregnancy: Secondary | ICD-10-CM | POA: Diagnosis not present

## 2023-11-15 DIAGNOSIS — R109 Unspecified abdominal pain: Secondary | ICD-10-CM

## 2023-11-15 DIAGNOSIS — O26893 Other specified pregnancy related conditions, third trimester: Secondary | ICD-10-CM

## 2023-11-15 NOTE — Final Progress Note (Signed)
Please see Discharge Summary  Melinda Evans, CNM  11/15/2023 12:01 AM

## 2023-11-15 NOTE — Discharge Summary (Signed)
Final Progress Note  Patient ID: Melinda Evans MRN: [id number] DOB/AGE: [birth year] 32 y.o.  Admit date: 11/14/2023 Admitting provider: Mirna Mires, CNM Discharge date: 11/15/2023   Admission Diagnoses: irregular contractions  Discharge Diagnoses:  Principal Problem:   Irregular uterine contractions  Reactive fetal heart tones  History of Present Illness: The patient is a 32 y.o. female G3P2002 at [redacted]w[redacted]d who presents for a labor check. She lives an hour from the hosptial, and felt some occasional belly tightening at home. She denies any vaginal bleeding, LOF and her baby is moving well. She was seen in the office today fro a prenatal visit and declined a vaginal exam .   Past Medical History:  Diagnosis Date   Bladder spasms    Gall stones    GERD (gastroesophageal reflux disease)    Headache(784.0)    UTI (lower urinary tract infection)     Past Surgical History:  Procedure Laterality Date   TONSILLECTOMY     WISDOM TOOTH EXTRACTION      No current facility-administered medications on file prior to encounter.   Current Outpatient Medications on File Prior to Encounter  Medication Sig Dispense Refill   prenatal vitamin w/FE, FA (NATACHEW) 29-1 MG CHEW chewable tablet Chew 2 tablets by mouth daily at 12 noon.     Butalbital-APAP-Caffeine 50-325-40 MG capsule Take 1-2 capsules by mouth every 6 (six) hours as needed for headache. 30 capsule 1   escitalopram (LEXAPRO) 10 MG tablet Take 1 tablet (10 mg total) by mouth daily. (Patient not taking: Reported on 11/14/2023) 90 tablet 3    No Known Allergies  Social History   Socioeconomic History   Marital status: Married    Spouse name: Nurse, children's   Number of children: 2   Years of education: 12   Highest education level: Not on file  Occupational History   Occupation: Print production planner  Tobacco Use   Smoking status: Never   Smokeless tobacco: Never  Vaping Use   Vaping status: Never Used  Substance and Sexual  Activity   Alcohol use: No   Drug use: No   Sexual activity: Yes    Partners: Male    Birth control/protection: None  Other Topics Concern   Not on file  Social History Narrative   Not on file   Social Determinants of Health   Financial Resource Strain: Low Risk  (04/20/2023)   Overall Financial Resource Strain (CARDIA)    Difficulty of Paying Living Expenses: Not very hard  Food Insecurity: No Food Insecurity (04/20/2023)   Hunger Vital Sign    Worried About Running Out of Food in the Last Year: Never true    Ran Out of Food in the Last Year: Never true  Transportation Needs: No Transportation Needs (04/20/2023)   PRAPARE - Administrator, Civil Service (Medical): No    Lack of Transportation (Non-Medical): No  Physical Activity: Sufficiently Active (04/20/2023)   Exercise Vital Sign    Days of Exercise per Week: 7 days    Minutes of Exercise per Session: 30 min  Stress: No Stress Concern Present (04/20/2023)   [employer] of Occupational Health - Occupational Stress Questionnaire    Feeling of Stress : Not at all  Social Connections: Moderately Integrated (04/20/2023)   Social Connection and Isolation Panel [NHANES]    Frequency of Communication with Friends and Family: More than three times a week    Frequency of Social Gatherings with Friends and Family: Once a week  Attends Religious Services: More than 4 times per year    Active Member of Clubs or Organizations: No    Attends Banker Meetings: Never    Marital Status: Married  Catering manager Violence: Not At Risk (04/20/2023)   Humiliation, Afraid, Rape, and Kick questionnaire    Fear of Current or Ex-Partner: No    Emotionally Abused: No    Physically Abused: No    Sexually Abused: No    Family History  Problem Relation Age of Onset   Diabetes Mother        gesttional   Healthy Father    Healthy Sister    Diabetes Maternal Grandmother    Hypertension Maternal Grandmother     Heart disease Maternal Grandfather    Healthy Paternal Grandmother    Diabetes Paternal Grandfather        pre diabetic   Hypertension Paternal Aunt      ROS   Physical Exam: BP 117/85   Pulse 87   LMP 02/27/2023 (Exact Date)   OBGyn Exam  Consults: None  Significant Findings/ Diagnostic Studies: none  Procedures: EFM NST Baseline FHR: 120 beats/min Variability: moderate Accelerations: present Decelerations: absent Tocometry: no contractions noted  Interpretation:  INDICATIONS: rule out uterine contractions RESULTS:  A NST procedure was performed with FHR monitoring and a normal baseline established, appropriate time of 20-40 minutes of evaluation, and accels >2 seen w 15x15 characteristics.  Results show a REACTIVE NST.    Hospital Course: The patient was admitted to Labor and Delivery Triage for observation. She was placed on the fetal monitor and a Category 1 strip was noted. A cervical exam revealed  dilation of 1.5 cms/30% effacement and the baby is ballotable. Hr vital signs were WNL. With a reassuring strip and no contractions noted, she is discharged home in care of her husband. She will f/u at [facility] at her next prenatal appointment.  Discharge Condition: good  Disposition: Discharge disposition: 01-Home or Self Care       Diet: Regular diet  Discharge Activity: Activity as tolerated  Discharge Instructions     Fetal Kick Count:  Lie on our left side for one hour after a meal, and count the number of times your baby kicks.  If it is less than 5 times, get up, move around and drink some juice.  Repeat the test 30 minutes later.  If it is still less than 5 kicks in an hour, notify your doctor.   Complete by: As directed    LABOR:  When conractions begin, you should start to time them from the beginning of one contraction to the beginning  of the next.  When contractions are 5 - 10 minutes apart or less and have been regular for at least an hour, you should  call your health care provider.   Complete by: As directed    Notify physician for bleeding from the vagina   Complete by: As directed    Notify physician for blurring of vision or spots before the eyes   Complete by: As directed    Notify physician for chills or fever   Complete by: As directed    Notify physician for fainting spells, "black outs" or loss of consciousness   Complete by: As directed    Notify physician for increase in vaginal discharge   Complete by: As directed    Notify physician for leaking of fluid   Complete by: As directed    Notify physician for pain  or burning when urinating   Complete by: As directed    Notify physician for pelvic pressure (sudden increase)   Complete by: As directed    Notify physician for severe or continued nausea or vomiting   Complete by: As directed    Notify physician for sudden gushing of fluid from the vagina (with or without continued leaking)   Complete by: As directed    Notify physician for sudden, constant, or occasional abdominal pain   Complete by: As directed    Notify physician if baby moving less than usual   Complete by: As directed       Allergies as of 11/15/2023   No Known Allergies      Medication List     TAKE these medications    Butalbital-APAP-Caffeine 50-325-40 MG capsule Take 1-2 capsules by mouth every 6 (six) hours as needed for headache.   escitalopram 10 MG tablet Commonly known as: LEXAPRO Take 1 tablet (10 mg total) by mouth daily.   prenatal vitamin w/FE, FA 29-1 MG Chew chewable tablet Chew 2 tablets by mouth daily at 12 noon.         Total time spent taking care of this patient: 35 minutes  Signed: Mirna Mires, CNM  11/15/2023, 12:02 AM

## 2023-11-16 ENCOUNTER — Other Ambulatory Visit: Payer: Self-pay | Admitting: Obstetrics

## 2023-11-16 ENCOUNTER — Encounter: Payer: Self-pay | Admitting: Advanced Practice Midwife

## 2023-11-16 DIAGNOSIS — Z8759 Personal history of other complications of pregnancy, childbirth and the puerperium: Secondary | ICD-10-CM

## 2023-11-16 DIAGNOSIS — Z3A39 39 weeks gestation of pregnancy: Secondary | ICD-10-CM

## 2023-11-21 ENCOUNTER — Encounter: Payer: Self-pay | Admitting: Advanced Practice Midwife

## 2023-11-21 ENCOUNTER — Ambulatory Visit (INDEPENDENT_AMBULATORY_CARE_PROVIDER_SITE_OTHER): Payer: Medicaid Other | Admitting: Advanced Practice Midwife

## 2023-11-21 VITALS — BP 133/91 | HR 81 | Wt 179.1 lb

## 2023-11-21 DIAGNOSIS — O163 Unspecified maternal hypertension, third trimester: Secondary | ICD-10-CM

## 2023-11-21 DIAGNOSIS — Z3A38 38 weeks gestation of pregnancy: Secondary | ICD-10-CM

## 2023-11-21 DIAGNOSIS — Z3483 Encounter for supervision of other normal pregnancy, third trimester: Secondary | ICD-10-CM

## 2023-11-21 NOTE — Progress Notes (Signed)
Routine Prenatal Care Visit  Subjective  Melinda Evans is a 32 y.o. [phone] at [redacted]w[redacted]d being seen today for ongoing prenatal care.  She is currently monitored for the following issues for this low-risk pregnancy and has Bruises easily; Anxiety; Supervision of other normal pregnancy, antepartum; Migraines; Positive GBS test; and Irregular uterine contractions on their problem list.  ----------------------------------------------------------------------------------- Patient reports increased pelvic pressure. She is having some irregular contractions. Pressures are mildly elevated today. No headaches, visual changes or epigastric pain. PIH labs done in office. IOL in 6 days. Precautions reviewed.  Contractions: Irregular. Vag. Bleeding: None.  Movement: Present. Leaking Fluid denies.  ----------------------------------------------------------------------------------- The following portions of the patient's history were reviewed and updated as appropriate: allergies, current medications, past family history, past medical history, past social history, past surgical history and problem list. Problem list updated.  Objective  Blood pressure (!) 131/93, pulse 83, weight 179 lb 1.6 oz (81.2 kg), last menstrual period 02/27/2023, unknown if currently breastfeeding. Pregravid weight 165 lb (74.8 kg) Total Weight Gain 14 lb 1.6 oz (6.396 kg) Urinalysis: Urine Protein    Urine Glucose    Fetal Status: Fetal Heart Rate (bpm): 127   Movement: Present     General:  Alert, oriented and cooperative. Patient is in no acute distress.  Skin: Skin is warm and dry. No rash noted.   Cardiovascular: Normal heart rate noted  Respiratory: Normal respiratory effort, no problems with respiration noted  Abdomen: Soft, gravid, appropriate for gestational age. Pain/Pressure: Present     Pelvic:  Cervical exam performed Dilation: 1.5 Effacement (%): 40 Station: Ballotable  Extremities: Normal range of motion.  Edema: Trace   Mental Status: Normal mood and affect. Normal behavior. Normal judgment and thought content.   Assessment   32 y.o. Q4O9629 at [redacted]w[redacted]d by  12/04/2023, by Last Menstrual Period presenting for routine prenatal visit  Plan   third Problems (from 04/20/23 to present)     Problem Noted Diagnosed Resolved   Positive GBS test 11/12/2023 by Free, Lindalou Hose, CNM  No   Migraines 07/27/2023 by Burney Gauze, CNM  No   Supervision of other normal pregnancy, antepartum 04/20/2023 by Loran Senters, CMA  No   Overview Addendum 11/14/2023 11:18 AM by Julieanne Manson, MD   Clinical Staff Provider  Office Location  [facility] Dating  LMP=10wk [place]  Language  English Anatomy US    Flu Vaccine  declines Genetic Screen  NIPS:   TDaP vaccine  09/13/23 Hgb A1C or  GTT Early : Third trimester : 107  Covid declined   LAB RESULTS   Rhogam  O/Positive/-- (06/07 5284)  Blood Type O/Positive/-- (06/07 1324)   Feeding Plan breast Antibody Negative (06/07 0914)  Contraception IUD? Rubella 2.67 (06/07 0914)  Circumcision yes RPR Non Reactive (10/10 0957)   Pediatrician  [facility] HBsAg Negative (06/07 0914)   Support Person Zackery HIV Non Reactive (10/10 0957)  Prenatal Classes no Varicella Immune    GBS Positive/-- (12/06 1011)  BTL Consent  Hep C Non Reactive (06/07 0914)   VBAC Consent  Pap No results found for: "DIAGPAP"    Hgb Electro    RSV Vaccine 10/11/23 CF      SMA                    Term labor symptoms and general obstetric precautions including but not limited to vaginal bleeding, contractions, leaking of fluid and fetal movement were reviewed in detail with the patient.  Please refer to After Visit Summary for other counseling recommendations.   Return for IOL 12/24.  Tresea Mall, CNM 11/21/2023 11:47 AM

## 2023-11-22 LAB — COMPREHENSIVE METABOLIC PANEL
ALT: 15 [IU]/L (ref 0–32)
AST: 19 [IU]/L (ref 0–40)
Albumin: 3.6 g/dL — ABNORMAL LOW (ref 4.0–5.0)
Alkaline Phosphatase: 94 [IU]/L (ref 44–121)
BUN/Creatinine Ratio: 12 (ref 9–23)
BUN: 6 mg/dL (ref 6–20)
Bilirubin Total: <0.2 mg/dL (ref 0.0–1.2)
CO2: 19 mmol/L — ABNORMAL LOW (ref 20–29)
Calcium: 8.8 mg/dL (ref 8.7–10.2)
Chloride: 104 mmol/L (ref 96–106)
Creatinine, Ser: 0.49 mg/dL — ABNORMAL LOW (ref 0.57–1.00)
Globulin, Total: 2.2 g/dL (ref 1.5–4.5)
Glucose: 67 mg/dL — ABNORMAL LOW (ref 70–99)
Potassium: 3.9 mmol/L (ref 3.5–5.2)
Sodium: 137 mmol/L (ref 134–144)
Total Protein: 5.8 g/dL — ABNORMAL LOW (ref 6.0–8.5)
eGFR: 130 mL/min/{1.73_m2} (ref >59)

## 2023-11-22 LAB — PROTEIN / CREATININE RATIO, URINE
Creatinine, Urine: 30.1 mg/dL
Protein, Ur: 6.6 mg/dL
Protein/Creat Ratio: 219 mg/g{creat} — ABNORMAL HIGH (ref 0–200)

## 2023-11-22 LAB — CBC
Hematocrit: 36.1 % (ref 34.0–46.6)
Hemoglobin: 11.9 g/dL (ref 11.1–15.9)
MCH: 32.4 pg (ref 26.6–33.0)
MCHC: 33 g/dL (ref 31.5–35.7)
MCV: 98 fL — ABNORMAL HIGH (ref 79–97)
Platelets: 192 10*3/uL (ref 150–450)
RBC: 3.67 x10E6/uL — ABNORMAL LOW (ref 3.77–5.28)
RDW: 12.8 % (ref 11.7–15.4)
WBC: 7.5 10*3/uL (ref 3.4–10.8)

## 2023-11-23 ENCOUNTER — Telehealth: Payer: Self-pay

## 2023-11-23 ENCOUNTER — Encounter: Payer: Self-pay | Admitting: Physician Assistant

## 2023-11-23 NOTE — Telephone Encounter (Signed)
Patient called with concerns of headaches and lab results. She was evaluated by Erskine Squibb on Wednesday. Protein and creatine labs resulted at 219. She has a history of migraines. I talked to Erskine Squibb about this patient and she was not concerned about lab results. She suggested that patient have her blood pressure checked if blood pressure is elevated and headaches are severe with no relief, she should go be evaluated at L&D. Patient verbalized understanding and said she would go and have BP checked and proceed to L&D if it was elevated.

## 2023-11-23 NOTE — Telephone Encounter (Signed)
Patient called, sent message to [facility] provider

## 2023-11-27 ENCOUNTER — Encounter: Payer: Self-pay | Admitting: Obstetrics

## 2023-11-27 ENCOUNTER — Inpatient Hospital Stay
Admission: EM | Admit: 2023-11-27 | Discharge: 2023-11-28 | DRG: 807 | Disposition: A | Discharge status: Home / Self Care | Payer: Medicaid Other | Attending: Obstetrics | Admitting: Obstetrics

## 2023-11-27 ENCOUNTER — Inpatient Hospital Stay: Payer: Medicaid Other | Admitting: Anesthesiology

## 2023-11-27 ENCOUNTER — Other Ambulatory Visit: Payer: Self-pay

## 2023-11-27 DIAGNOSIS — O26893 Other specified pregnancy related conditions, third trimester: Secondary | ICD-10-CM | POA: Diagnosis present

## 2023-11-27 DIAGNOSIS — Z3A39 39 weeks gestation of pregnancy: Secondary | ICD-10-CM | POA: Diagnosis not present

## 2023-11-27 DIAGNOSIS — K219 Gastro-esophageal reflux disease without esophagitis: Secondary | ICD-10-CM | POA: Diagnosis present

## 2023-11-27 DIAGNOSIS — O99820 Streptococcus B carrier state complicating pregnancy: Secondary | ICD-10-CM | POA: Diagnosis not present

## 2023-11-27 DIAGNOSIS — Z8759 Personal history of other complications of pregnancy, childbirth and the puerperium: Principal | ICD-10-CM

## 2023-11-27 DIAGNOSIS — O09893 Supervision of other high risk pregnancies, third trimester: Secondary | ICD-10-CM | POA: Diagnosis not present

## 2023-11-27 DIAGNOSIS — O99824 Streptococcus B carrier state complicating childbirth: Secondary | ICD-10-CM | POA: Diagnosis present

## 2023-11-27 DIAGNOSIS — O9962 Diseases of the digestive system complicating childbirth: Secondary | ICD-10-CM | POA: Diagnosis present

## 2023-11-27 DIAGNOSIS — R030 Elevated blood-pressure reading, without diagnosis of hypertension: Secondary | ICD-10-CM | POA: Diagnosis present

## 2023-11-27 HISTORY — DX: Personal history of other complications of pregnancy, childbirth and the puerperium: Z87.59

## 2023-11-27 LAB — CBC
HCT: 35.9 % — ABNORMAL LOW (ref 36.0–46.0)
Hemoglobin: 12.5 g/dL (ref 12.0–15.0)
MCH: 33.2 pg (ref 26.0–34.0)
MCHC: 34.8 g/dL (ref 30.0–36.0)
MCV: 95.2 fL (ref 80.0–100.0)
Platelets: 195 10*3/uL (ref 150–400)
RBC: 3.77 MIL/uL — ABNORMAL LOW (ref 3.87–5.11)
RDW: 13.9 % (ref 11.5–15.5)
WBC: 10.7 10*3/uL — ABNORMAL HIGH (ref 4.0–10.5)
nRBC: 0 % (ref 0.0–0.2)

## 2023-11-27 LAB — TYPE AND SCREEN
ABO/RH(D): O POS
Antibody Screen: NEGATIVE

## 2023-11-27 LAB — URINALYSIS, ROUTINE W REFLEX MICROSCOPIC
Bacteria, UA: NONE SEEN
Bilirubin Urine: NEGATIVE
Glucose, UA: NEGATIVE mg/dL
Ketones, ur: NEGATIVE mg/dL
Nitrite: NEGATIVE
Protein, ur: NEGATIVE mg/dL
Specific Gravity, Urine: 1.02 (ref 1.005–1.030)
pH: 7 (ref 5.0–8.0)

## 2023-11-27 LAB — RPR: RPR Ser Ql: NONREACTIVE

## 2023-11-27 MED ORDER — MISOPROSTOL 25 MCG QUARTER TABLET
25.0000 ug | ORAL_TABLET | Freq: Once | ORAL | Status: AC
  Administered 2023-11-27: 25 ug via VAGINAL
  Filled 2023-11-27: qty 1

## 2023-11-27 MED ORDER — LIDOCAINE HCL (PF) 1 % IJ SOLN
INTRAMUSCULAR | Status: DC | PRN
  Administered 2023-11-27: 2 mL via INTRADERMAL

## 2023-11-27 MED ORDER — MISOPROSTOL 200 MCG PO TABS
ORAL_TABLET | ORAL | Status: AC
  Filled 2023-11-27: qty 4

## 2023-11-27 MED ORDER — LACTATED RINGERS IV SOLN
500.0000 mL | Freq: Once | INTRAVENOUS | Status: AC
  Administered 2023-11-27: 500 mL via INTRAVENOUS

## 2023-11-27 MED ORDER — LIDOCAINE HCL (PF) 1 % IJ SOLN: 30.0000 mL | INTRAMUSCULAR | Status: DC | PRN

## 2023-11-27 MED ORDER — MISOPROSTOL 50MCG HALF TABLET
50.0000 ug | ORAL_TABLET | ORAL | Status: DC
  Administered 2023-11-27: 50 ug via ORAL
  Filled 2023-11-27: qty 1

## 2023-11-27 MED ORDER — LIDOCAINE HCL (PF) 1 % IJ SOLN
INTRAMUSCULAR | Status: AC
  Filled 2023-11-27: qty 30

## 2023-11-27 MED ORDER — BUPIVACAINE HCL (PF) 0.25 % IJ SOLN
INTRAMUSCULAR | Status: DC | PRN
  Administered 2023-11-27: 5 mL via EPIDURAL
  Administered 2023-11-27: 4 mL via EPIDURAL

## 2023-11-27 MED ORDER — OXYTOCIN BOLUS FROM INFUSION
333.0000 mL | Freq: Once | INTRAVENOUS | Status: AC
  Administered 2023-11-27: 333 mL via INTRAVENOUS

## 2023-11-27 MED ORDER — ONDANSETRON HCL 4 MG PO TABS: 4.0000 mg | ORAL_TABLET | ORAL | Status: DC | PRN

## 2023-11-27 MED ORDER — FENTANYL-BUPIVACAINE-NACL 0.5-0.125-0.9 MG/250ML-% EP SOLN
EPIDURAL | Status: AC
  Filled 2023-11-27: qty 250

## 2023-11-27 MED ORDER — LIDOCAINE-EPINEPHRINE (PF) 1.5 %-1:200000 IJ SOLN
INTRAMUSCULAR | Status: DC | PRN
  Administered 2023-11-27: 3 mL via PERINEURAL

## 2023-11-27 MED ORDER — ZOLPIDEM TARTRATE 5 MG PO TABS: 5.0000 mg | ORAL_TABLET | Freq: Every evening | ORAL | Status: DC | PRN

## 2023-11-27 MED ORDER — OXYTOCIN-SODIUM CHLORIDE 30-0.9 UT/500ML-% IV SOLN
1.0000 m[IU]/min | INTRAVENOUS | Status: DC
Start: 2023-11-27 — End: 2023-11-27

## 2023-11-27 MED ORDER — PHENYLEPHRINE 80 MCG/ML (10ML) SYRINGE FOR IV PUSH (FOR BLOOD PRESSURE SUPPORT): 80.0000 ug | PREFILLED_SYRINGE | INTRAVENOUS | Status: DC | PRN

## 2023-11-27 MED ORDER — AMMONIA AROMATIC IN INHA
RESPIRATORY_TRACT | Status: AC
  Filled 2023-11-27: qty 10

## 2023-11-27 MED ORDER — EPHEDRINE 5 MG/ML INJ: 10.0000 mg | INTRAVENOUS | Status: DC | PRN

## 2023-11-27 MED ORDER — DIPHENHYDRAMINE HCL 50 MG/ML IJ SOLN: 12.5000 mg | INTRAMUSCULAR | Status: DC | PRN

## 2023-11-27 MED ORDER — CALCIUM CARBONATE ANTACID 500 MG PO CHEW
2.0000 | CHEWABLE_TABLET | Freq: Three times a day (TID) | ORAL | Status: DC | PRN
  Administered 2023-11-27: 400 mg via ORAL
  Filled 2023-11-27: qty 2

## 2023-11-27 MED ORDER — PRENATAL MULTIVITAMIN CH
1.0000 | ORAL_TABLET | Freq: Every day | ORAL | Status: DC
  Administered 2023-11-28: 1 via ORAL
  Filled 2023-11-27: qty 1

## 2023-11-27 MED ORDER — MISOPROSTOL 50MCG HALF TABLET
50.0000 ug | ORAL_TABLET | ORAL | Status: DC
  Administered 2023-11-27: 50 ug via VAGINAL
  Filled 2023-11-27: qty 1

## 2023-11-27 MED ORDER — DIPHENHYDRAMINE HCL 25 MG PO CAPS
25.0000 mg | ORAL_CAPSULE | Freq: Four times a day (QID) | ORAL | Status: DC | PRN
Start: 2023-11-27 — End: 2023-11-28

## 2023-11-27 MED ORDER — DOCUSATE SODIUM 100 MG PO CAPS
100.0000 mg | ORAL_CAPSULE | Freq: Two times a day (BID) | ORAL | Status: DC
  Administered 2023-11-27 – 2023-11-28 (×2): 100 mg via ORAL
  Filled 2023-11-27 (×2): qty 1

## 2023-11-27 MED ORDER — ACETAMINOPHEN 500 MG PO TABS: 1000.0000 mg | ORAL_TABLET | Freq: Four times a day (QID) | ORAL | Status: DC | PRN

## 2023-11-27 MED ORDER — LACTATED RINGERS IV SOLN: INTRAVENOUS | Status: DC

## 2023-11-27 MED ORDER — ACETAMINOPHEN 500 MG PO TABS
1000.0000 mg | ORAL_TABLET | Freq: Four times a day (QID) | ORAL | Status: DC
  Administered 2023-11-27 – 2023-11-28 (×4): 1000 mg via ORAL
  Filled 2023-11-27 (×5): qty 2

## 2023-11-27 MED ORDER — TERBUTALINE SULFATE 1 MG/ML IJ SOLN: 0.2500 mg | Freq: Once | INTRAMUSCULAR | Status: DC | PRN

## 2023-11-27 MED ORDER — FENTANYL CITRATE (PF) 100 MCG/2ML IJ SOLN: 50.0000 ug | INTRAMUSCULAR | Status: DC | PRN

## 2023-11-27 MED ORDER — COCONUT OIL OIL
1.0000 | TOPICAL_OIL | Status: DC | PRN
  Administered 2023-11-28: 1 via TOPICAL
  Filled 2023-11-27: qty 7.5

## 2023-11-27 MED ORDER — OXYTOCIN-SODIUM CHLORIDE 30-0.9 UT/500ML-% IV SOLN: 2.5000 [IU]/h | INTRAVENOUS | Status: DC

## 2023-11-27 MED ORDER — DIBUCAINE (PERIANAL) 1 % EX OINT
1.0000 | TOPICAL_OINTMENT | CUTANEOUS | Status: DC | PRN
  Administered 2023-11-27 (×2): 1 via RECTAL
  Filled 2023-11-27: qty 28

## 2023-11-27 MED ORDER — CALCIUM CARBONATE ANTACID 500 MG PO CHEW
200.0000 mg | CHEWABLE_TABLET | ORAL | Status: DC | PRN
  Administered 2023-11-27: 200 mg via ORAL
  Filled 2023-11-27: qty 1

## 2023-11-27 MED ORDER — OXYTOCIN 10 UNIT/ML IJ SOLN
INTRAMUSCULAR | Status: AC
  Filled 2023-11-27: qty 2

## 2023-11-27 MED ORDER — SIMETHICONE 80 MG PO CHEW: 80.0000 mg | CHEWABLE_TABLET | ORAL | Status: DC | PRN

## 2023-11-27 MED ORDER — FENTANYL-BUPIVACAINE-NACL 0.5-0.125-0.9 MG/250ML-% EP SOLN
12.0000 mL/h | EPIDURAL | Status: DC | PRN
  Administered 2023-11-27: 12 mL/h via EPIDURAL

## 2023-11-27 MED ORDER — IBUPROFEN 600 MG PO TABS
600.0000 mg | ORAL_TABLET | Freq: Four times a day (QID) | ORAL | Status: DC
  Administered 2023-11-27 – 2023-11-28 (×5): 600 mg via ORAL
  Filled 2023-11-27 (×5): qty 1

## 2023-11-27 MED ORDER — ONDANSETRON HCL 4 MG/2ML IJ SOLN: 4.0000 mg | INTRAMUSCULAR | Status: DC | PRN

## 2023-11-27 MED ORDER — HYDROXYZINE HCL 25 MG PO TABS: 50.0000 mg | ORAL_TABLET | Freq: Four times a day (QID) | ORAL | Status: DC | PRN

## 2023-11-27 MED ORDER — PENICILLIN G POT IN DEXTROSE 60000 UNIT/ML IV SOLN
3.0000 10*6.[IU] | INTRAVENOUS | Status: DC
  Administered 2023-11-27: 3 10*6.[IU] via INTRAVENOUS
  Filled 2023-11-27: qty 50

## 2023-11-27 MED ORDER — SOD CITRATE-CITRIC ACID 500-334 MG/5ML PO SOLN: 30.0000 mL | ORAL | Status: DC | PRN

## 2023-11-27 MED ORDER — MISOPROSTOL 25 MCG QUARTER TABLET
25.0000 ug | ORAL_TABLET | Freq: Once | ORAL | Status: AC
  Administered 2023-11-27: 25 ug via ORAL
  Filled 2023-11-27: qty 1

## 2023-11-27 MED ORDER — ONDANSETRON HCL 4 MG/2ML IJ SOLN: 4.0000 mg | Freq: Four times a day (QID) | INTRAMUSCULAR | Status: DC | PRN

## 2023-11-27 MED ORDER — BENZOCAINE-MENTHOL 20-0.5 % EX AERO
1.0000 | INHALATION_SPRAY | CUTANEOUS | Status: DC | PRN
  Administered 2023-11-27: 1 via TOPICAL

## 2023-11-27 MED ORDER — OXYTOCIN-SODIUM CHLORIDE 30-0.9 UT/500ML-% IV SOLN
INTRAVENOUS | Status: AC
  Filled 2023-11-27: qty 500

## 2023-11-27 MED ORDER — MISOPROSTOL 50MCG HALF TABLET: 50.0000 ug | ORAL_TABLET | ORAL | Status: DC | PRN

## 2023-11-27 MED ORDER — SODIUM CHLORIDE 0.9 % IV SOLN
5.0000 10*6.[IU] | Freq: Once | INTRAVENOUS | Status: AC
  Administered 2023-11-27: 5 10*6.[IU] via INTRAVENOUS
  Filled 2023-11-27: qty 5

## 2023-11-27 MED ORDER — WITCH HAZEL-GLYCERIN EX PADS
1.0000 | MEDICATED_PAD | CUTANEOUS | Status: DC | PRN
  Administered 2023-11-27 (×2): 1 via TOPICAL
  Filled 2023-11-27: qty 100

## 2023-11-27 MED ORDER — LACTATED RINGERS IV SOLN: 500.0000 mL | INTRAVENOUS | Status: DC | PRN

## 2023-11-27 NOTE — Discharge Summary (Signed)
Postpartum Discharge Summary  Date of Service updated***     Patient Name: Melinda Evans DOB: 1991-01-11 MRN: [id number]  Date of admission: 11/27/2023 Delivery date:11/27/2023 Delivering provider: Carie Caddy MARIE Date of discharge: 11/27/2023  Admitting diagnosis: History of precipitous labor and delivery [Z87.59] Intrauterine pregnancy: [redacted]w[redacted]d     Secondary diagnosis:  Principal Problem:   History of precipitous labor and delivery  Additional problems: elevated blood pressure     Discharge diagnosis: Term Pregnancy Delivered                                              Post partum procedures:{Postpartum procedures:23558} Augmentation: AROM and Cytotec Complications: None  Hospital course: Induction of Labor With Vaginal Delivery   32 y.o. yo [id number] at [redacted]w[redacted]d was admitted to the hospital 11/27/2023 for induction of labor.  Indication for induction: Favorable cervix at term.  Patient had an labor course complicated byNA Membrane Rupture Time/Date:  ,   Delivery Method:Vaginal, Spontaneous Operative Delivery:N/A Episiotomy: None Lacerations:   intact  Details of delivery can be found in separate delivery note.  Patient had a postpartum course complicated by***. Patient is discharged home 11/27/23.  Newborn Data: Birth date:11/27/2023 Birth time:11:25 AM Gender:Female Living status:  Apgars:9 ,9  Weight:   Magnesium Sulfate received: No BMZ received: No Rhophylac:N/A MMR:N/A T-DaP:Given prenatally Flu: Yes RSV Vaccine received: Yes Transfusion:{Transfusion received:30440034} Immunizations administered: Immunization History  Administered Date(s) Administered   Influenza Split 09/08/2013   Influenza,inj,Quad PF,6+ Mos 11/15/2022   Rsv, Bivalent, Protein Subunit Rsvpref,pf Verdis Frederickson) 10/11/2023   Tdap 07/29/2013, 09/13/2023    Physical exam  Vitals:   11/27/23 1028 11/27/23 1030 11/27/23 1031 11/27/23 1102  BP: 126/81  125/78 (!) 123/93  Pulse: 88   88 88  Resp:      Temp:      TempSrc:      SpO2: 98% 97%  99%   General: {Exam; general:21111117} Lochia: {Desc; appropriate/inappropriate:30686::"appropriate"} Uterine Fundus: {Desc; firm/soft:30687} Incision: {Exam; incision:21111123} DVT Evaluation: {Exam; dvt:2111122} Labs: Lab Results  Component Value Date   WBC 10.7 (H) 11/27/2023   HGB 12.5 11/27/2023   HCT 35.9 (L) 11/27/2023   MCV 95.2 11/27/2023   PLT 195 11/27/2023      Latest Ref Rng & Units 11/21/2023   11:57 AM  CMP  Glucose 70 - 99 mg/dL 67   BUN 6 - 20 mg/dL 6   Creatinine 6.29 - 5.28 mg/dL 4.13   Sodium 244 - 010 mmol/L 137   Potassium 3.5 - 5.2 mmol/L 3.9   Chloride 96 - 106 mmol/L 104   CO2 20 - 29 mmol/L 19   Calcium 8.7 - 10.2 mg/dL 8.8   Total Protein 6.0 - 8.5 g/dL 5.8   Total Bilirubin 0.0 - 1.2 mg/dL <2.7   Alkaline Phos 44 - 121 IU/L 94   AST 0 - 40 IU/L 19   ALT 0 - 32 IU/L 15    Edinburgh Score:    04/20/2023    9:29 AM  Edinburgh Postnatal Depression Scale Screening Tool  I have been able to laugh and see the funny side of things. 0  I have looked forward with enjoyment to things. 0  I have blamed myself unnecessarily when things went wrong. 0  I have been anxious or worried for no good reason. 0  I have felt scared  or panicky for no good reason. 0  Things have been getting on top of me. 0  I have been so unhappy that I have had difficulty sleeping. 0  I have felt sad or miserable. 0  I have been so unhappy that I have been crying. 0  The thought of harming myself has occurred to me. 0  Edinburgh Postnatal Depression Scale Total 0      After visit meds:  Allergies as of 11/27/2023   No Known Allergies   Med Rec must be completed prior to using this [facility]***        Discharge home in stable condition Infant Feeding: Bottle Infant Disposition:home with mother Discharge instruction: per After Visit Summary and Postpartum booklet. Activity: Advance as tolerated.  Pelvic rest for 6 weeks.  Diet: routine diet Anticipated Birth Control: Unsure Postpartum Appointment:2 weeks Additional Postpartum F/U: Postpartum Depression checkup Future Appointments:No future appointments. Follow up Visit:  Follow-up Information     Keyera Hattabaugh, Courtney Heys, CNM Follow up in 2 week(s).   Specialty: Obstetrics and Gynecology Why: 2wk virtual and then 6wk in person Contact information: [address] [place] [address] [phone]                     11/27/2023 [name] Natania Finigan, CNM

## 2023-11-27 NOTE — Progress Notes (Signed)
LABOR NOTE   Melinda Evans 32 y.o.GP@ at [redacted]w[redacted]d  SUBJECTIVE:  Mild cramping discomfort Analgesia: Labor support without medications  OBJECTIVE:  LMP 02/27/2023 (Exact Date)  No intake/output data recorded.  She has not shown cervical change. CERVIX: 2 cm:  70:   -3:   firm SVE:   Dilation: 2 Effacement (%): 70 Station: -3 Exam by:: Katie A RN CONTRACTIONS: irritability , cramping FHR: Fetal heart tracing reviewed. Baseline: 130 bpm, Variability: Good {> 6 bpm), Accelerations: Reactive, and Decelerations: Absent Category I    Labs: Lab Results  Component Value Date   WBC 10.7 (H) 11/27/2023   HGB 12.5 11/27/2023   HCT 35.9 (L) 11/27/2023   MCV 95.2 11/27/2023   PLT 195 11/27/2023    ASSESSMENT: 1) Labor curve reviewed.       Progress: Not in labor.     Membranes: intact    Principal Problem:   History of precipitous labor and delivery Elective induction  PLAN: continue present management   Doreene Burke, CNM  11/27/2023 6:03 AM

## 2023-11-27 NOTE — H&P (Signed)
History and Physical   HPI  Melinda Evans is a 32 y.o. Q6V7846 at [redacted]w[redacted]d Estimated Date of Delivery: 12/04/23 who is being admitted for induction of labor due to history of fast labors.    OB History  OB History  Gravida Para Term Preterm AB Living  3 2 2  0 0 2  SAB IAB Ectopic Multiple Live Births  0 0 0 0 2    # Outcome Date GA Lbr Len/2nd Weight Sex Type Anes PTL Lv  3 Current           2 Term 04/25/15 [redacted]w[redacted]d 07:34 / 00:01 3105 g M Vag-Spont EPI  LIV     Apgar1: 9  Apgar5: 9  1 Term 10/21/13 [redacted]w[redacted]d 17:45 / 00:40 3487 g F Vag-Spont EPI  LIV     Name: Seward Meth     Apgar1: 9  Apgar5: 9    PROBLEM LIST  Pregnancy complications or risks: Patient Active Problem List   Diagnosis Date Noted   History of precipitous labor and delivery 11/27/2023   Irregular uterine contractions 11/14/2023   Positive GBS test 11/12/2023   Migraines 07/27/2023   Supervision of other normal pregnancy, antepartum 04/20/2023   Bruises easily 01/03/2023   Anxiety 01/03/2023    Prenatal labs and studies: ABO, Rh: --/--/O POS (12/24 0024) Antibody: NEG (12/24 0024) Rubella: 2.67 (06/07 0914) RPR: Non Reactive (10/10 0957)  HBsAg: Negative (06/07 0914)  HIV: Non Reactive (10/10 0957)  NGE:XBMWUXLK/-- (12/06 1011)   Past Medical History:  Diagnosis Date   Bladder spasms    Gall stones    GERD (gastroesophageal reflux disease)    Headache(784.0)    UTI (lower urinary tract infection)      Past Surgical History:  Procedure Laterality Date   TONSILLECTOMY     WISDOM TOOTH EXTRACTION       Medications    Current Discharge Medication List     CONTINUE these medications which have NOT CHANGED   Details  Butalbital-APAP-Caffeine 50-325-40 MG capsule Take 1-2 capsules by mouth every 6 (six) hours as needed for headache. Qty: 30 capsule, Refills: 1   Associated Diagnoses: Other migraine without status migrainosus, not intractable    escitalopram (LEXAPRO) 10 MG  tablet Take 1 tablet (10 mg total) by mouth daily. Qty: 90 tablet, Refills: 3   Associated Diagnoses: Anxiety    prenatal vitamin w/FE, FA (NATACHEW) 29-1 MG CHEW chewable tablet Chew 2 tablets by mouth daily at 12 noon.         Allergies  Patient has no known allergies.  Review of Systems  Constitutional: negative Eyes: negative Ears, nose, mouth, throat, and face: negative Respiratory: negative Cardiovascular: negative Gastrointestinal: negative Genitourinary:negative Integument/breast: negative Hematologic/lymphatic: negative Musculoskeletal:negative Neurological: negative Behavioral/Psych: negative Endocrine: negative Allergic/Immunologic: negative  Physical Exam  LMP 02/27/2023 (Exact Date)   Lungs:  CTA B Cardio: RRR without M/R/G Abd: Soft, gravid, NT Presentation: cephalic EXT: No C/C/ 1+ Edema DTRs: 2+ B CERVIX: Dilation: 2 Effacement (%): 70 Station: -3 Exam by:: Katie A RN  See Prenatal records for more detailed PE.     FHR:  Baseline: 125 bpm, Variability: Good {> 6 bpm), Accelerations: Reactive, and Decelerations: Absent  Toco: Uterine Contractions: irritability   Test Results  Results for orders placed or performed during the hospital encounter of 11/27/23 (from the past 24 hours)  Urinalysis, Routine w reflex microscopic -Urine, Clean Catch     Status: Abnormal   Collection Time: 11/27/23 12:24  AM  Result Value Ref Range   Color, Urine YELLOW (A) YELLOW   APPearance HAZY (A) CLEAR   Specific Gravity, Urine 1.020 1.005 - 1.030   pH 7.0 5.0 - 8.0   Glucose, UA NEGATIVE NEGATIVE mg/dL   Hgb urine dipstick MODERATE (A) NEGATIVE   Bilirubin Urine NEGATIVE NEGATIVE   Ketones, ur NEGATIVE NEGATIVE mg/dL   Protein, ur NEGATIVE NEGATIVE mg/dL   Nitrite NEGATIVE NEGATIVE   Leukocytes,Ua MODERATE (A) NEGATIVE   RBC / HPF 0-5 0 - 5 RBC/hpf   WBC, UA 11-20 0 - 5 WBC/hpf   Bacteria, UA NONE SEEN NONE SEEN   Squamous Epithelial / HPF 6-10 0  - 5 /HPF   Mucus PRESENT   Type and screen     Status: None   Collection Time: 11/27/23 12:24 AM  Result Value Ref Range   ABO/RH(D) O POS    Antibody Screen NEG    Sample Expiration      11/30/2023,2359 Performed at [facility], [address] [place], [place] [address]   CBC     Status: Abnormal   Collection Time: 11/27/23 12:24 AM  Result Value Ref Range   WBC 10.7 (H) 4.0 - 10.5 K/uL   RBC 3.77 (L) 3.87 - 5.11 MIL/uL   Hemoglobin 12.5 12.0 - 15.0 g/dL   HCT 19.1 (L) 47.8 - 29.5 %   MCV 95.2 80.0 - 100.0 fL   MCH 33.2 26.0 - 34.0 pg   MCHC 34.8 30.0 - 36.0 g/dL   RDW 62.1 30.8 - 65.7 %   Platelets 195 150 - 400 K/uL   nRBC 0.0 0.0 - 0.2 %   Group B Strep positive  Assessment   G3P2002 at [redacted]w[redacted]d Estimated Date of Delivery: 12/04/23  The fetus is reassuring.   Patient Active Problem List   Diagnosis Date Noted   History of precipitous labor and delivery 11/27/2023   Irregular uterine contractions 11/14/2023   Positive GBS test 11/12/2023   Migraines 07/27/2023   Supervision of other normal pregnancy, antepartum 04/20/2023   Bruises easily 01/03/2023   Anxiety 01/03/2023    Plan  1. Admit to L&D :   2. EFM:-- Category 1 3. IV pain medication prn,  Epidural if desired.   4. Admission labs completed  5. Anticipate NSVD   Doreene Burke, CNM  11/27/2023 3:15 AM

## 2023-11-27 NOTE — Progress Notes (Signed)
Melinda Evans is a 32 y.o. W2N5621 at [redacted]w[redacted]d by LMP admitted for induction of labor due to Elective at term.  Subjective: Now more uncomfortable, desires main management.   Objective: BP 119/74 (BP Location: Left Arm)   Pulse 76   Temp 97.7 F (36.5 C) (Oral)   Resp 14   LMP 02/27/2023 (Exact Date)  No intake/output data recorded. No intake/output data recorded.  FHT:  FHR: 135 bpm, variability: moderate,  accelerations:  Present,  decelerations:  Absent UC:   regular, every 2-3 minutes SVE:   Dilation: 4 Effacement (%): 70 Station: -1 Exam by:: Melinda Evans CNM  Labs: Lab Results  Component Value Date   WBC 10.7 (H) 11/27/2023   HGB 12.5 11/27/2023   HCT 35.9 (L) 11/27/2023   MCV 95.2 11/27/2023   PLT 195 11/27/2023    Assessment / Plan: Elective IOL progressing well   Labor: Progressing normally AROM once comfortable with epidural   Fetal Wellbeing:  Category I Pain Control:  Anesthesia notified for epidural placement  I/D:   GBS positive received PCN x2, Membranes intact  Anticipated MOD:  NSVD  Melinda Evans, CNM 11/27/2023, 9:55 AM

## 2023-11-27 NOTE — Anesthesia Preprocedure Evaluation (Addendum)
Anesthesia Evaluation  Patient identified by MRN, date of birth, ID band Patient awake    Reviewed: Allergy & Precautions, H&P , NPO status , Patient's Chart, lab work & pertinent test results  Airway Mallampati: III  TM Distance: >3 FB Neck ROM: full    Dental  (+) Chipped   Pulmonary neg pulmonary ROS   Pulmonary exam normal        Cardiovascular Exercise Tolerance: Good negative cardio ROS Normal cardiovascular exam     Neuro/Psych  Headaches  Anxiety        GI/Hepatic ,GERD  Controlled,,  Endo/Other    Renal/GU   negative genitourinary   Musculoskeletal   Abdominal   Peds  Hematology negative hematology ROS (+)   Anesthesia Other Findings Past Medical History: No date: Bladder spasms No date: Gall stones No date: GERD (gastroesophageal reflux disease) No date: Headache(784.0) No date: UTI (lower urinary tract infection)  Past Surgical History: No date: TONSILLECTOMY No date: WISDOM TOOTH EXTRACTION     Reproductive/Obstetrics (+) Pregnancy                             Anesthesia Physical Anesthesia Plan  ASA: 3  Anesthesia Plan: Epidural   Post-op Pain Management:    Induction:   PONV Risk Score and Plan:   Airway Management Planned: Natural Airway  Additional Equipment:   Intra-op Plan:   Post-operative Plan:   Informed Consent: I have reviewed the patients History and Physical, chart, labs and discussed the procedure including the risks, benefits and alternatives for the proposed anesthesia with the patient or authorized representative who has indicated his/her understanding and acceptance.     Dental Advisory Given  Plan Discussed with: CRNA  Anesthesia Plan Comments: (Patient reports no bleeding problems and no anticoagulant use.   Patient consented for risks of anesthesia including but not limited to:  - adverse reactions to medications - risk of  bleeding, infection and or nerve damage from epidural that could lead to paralysis - risk of headache or failed epidural - nerve damage due to positioning - that if epidural is used for C-section that there is a chance of epidural failure requiring spinal placement or conversion to GA - Damage to heart, brain, lungs, other parts of body or loss of life  Patient voiced understanding and assent.)       Anesthesia Quick Evaluation

## 2023-11-27 NOTE — Progress Notes (Signed)
Melinda Evans is a 32 y.o. [id number] at [redacted]w[redacted]d by LMP admitted for induction of labor due to Elective at term.  Subjective: Comfortable with epidural. Family at the bedside.   Objective: BP 125/78   Pulse 88   Temp 97.7 F (36.5 C) (Oral)   Resp 14   LMP 02/27/2023 (Exact Date)   SpO2 97%  No intake/output data recorded. No intake/output data recorded.  FHT:  FHR: 135 bpm, variability: moderate,  accelerations:  Present,  decelerations:  Present variables UC:   regular, every 1-2 minutes SVE:   Dilation: 6 Effacement (%): 90 Station: -1 Exam by:: Melinda Evans CNM  Labs: Lab Results  Component Value Date   WBC 10.7 (H) 11/27/2023   HGB 12.5 11/27/2023   HCT 35.9 (L) 11/27/2023   MCV 95.2 11/27/2023   PLT 195 11/27/2023    Assessment / Plan: IOL progressing well   Labor: Progressing normally  Fetal Wellbeing:  Category I Pain Control:  Epidural I/D:   GBS pos PCN x2, AROM at  1058 Anticipated MOD:  NSVD  DR Melinda Evans updated on pt's status   Melinda Evans [facility], CNM 11/27/2023, 11:15 AM

## 2023-11-27 NOTE — Anesthesia Procedure Notes (Signed)
Epidural Patient location during procedure: OB  Staffing Resident/CRNA: Mathews Argyle, CRNA Performed: resident/CRNA   Preanesthetic Checklist Completed: patient identified, IV checked, site marked, risks and benefits discussed, surgical consent, monitors and equipment checked, pre-op evaluation and timeout performed  Epidural Patient position: sitting Prep: ChloraPrep and site prepped and draped Patient monitoring: heart rate, continuous pulse ox and blood pressure Approach: midline Location: L4-L5 Injection technique: LOR saline  Needle:  Needle type: Tuohy  Needle gauge: 17 G Needle length: 9 cm Needle insertion depth: 8 cm Catheter type: closed end flexible Catheter size: 19 Gauge Catheter at skin depth: 13 cm Test dose: negative and 1.5% lidocaine with Epi 1:200 K  Assessment Events: blood not aspirated, no cerebrospinal fluid, injection not painful, no injection resistance, no paresthesia and negative IV test  Additional Notes   Patient tolerated the insertion well without complications.Reason for block:procedure for pain

## 2023-11-27 NOTE — Lactation Note (Signed)
This note was copied from a baby's chart. Lactation Consultation Note  Patient Name: Melinda Evans [id number] Date: 11/27/2023 Age:30 hours Reason for consult: Initial assessment;Term   Maternal Data Has patient been taught Hand Expression?:  (Patient stated she knows how to do hand expression.) Does the patient have breastfeeding experience prior to this delivery?: Yes How long did the patient breastfeed?: 55yr for both kids  This is patients 3rd baby.  This was a SVD.  Feeding goal is breastfeeding.  Patient w/ a hx of precipitous birth and anxiety.    Patient breastfed her other children for 34yr w/ no complications. Patient does not have a breast pump at home.  LC offered a manual breast pump to patient and recommended patient to request it if she desires. Patient verbalized understanding.  Feeding Mother's Current Feeding Choice: Breast Milk  Infant feeding on the left breast upon entry into the room.  No questions or concerns at the time of the visit.   LATCH Score Latch: Grasps breast easily, tongue down, lips flanged, rhythmical sucking.  Audible Swallowing: A few with stimulation  Type of Nipple: Everted at rest and after stimulation  Comfort (Breast/Nipple): Soft / non-tender  Hold (Positioning): No assistance needed to correctly position infant at breast.  LATCH Score: 9  Interventions Interventions: Breast feeding basics reviewed;Education  LC provided education on the following;  milk production expectations, hunger cues, day 1/2 wet/dirty diapers, hand expression,  benefits of STS and arousing infant for a feeding.  Lactation informed patient of feeding infant at least 8 or more times w/in a 24hr period but not exceeding 3hrs. Patient verbalized understanding.     Discharge Pump:  (Patient does not have a pump at home.) [facility] Program: No  Consult Status Consult Status: Follow-up Date: 11/28/23 Follow-up type: In-patient    [name] 11/27/2023,  3:11 PM

## 2023-11-28 LAB — CBC
HCT: 33.3 % — ABNORMAL LOW (ref 36.0–46.0)
Hemoglobin: 11.6 g/dL — ABNORMAL LOW (ref 12.0–15.0)
MCH: 33.1 pg (ref 26.0–34.0)
MCHC: 34.8 g/dL (ref 30.0–36.0)
MCV: 95.1 fL (ref 80.0–100.0)
Platelets: 162 10*3/uL (ref 150–400)
RBC: 3.5 MIL/uL — ABNORMAL LOW (ref 3.87–5.11)
RDW: 14 % (ref 11.5–15.5)
WBC: 9.6 10*3/uL (ref 4.0–10.5)
nRBC: 0 % (ref 0.0–0.2)

## 2023-11-28 LAB — ABO/RH: ABO/RH(D): O POS

## 2023-11-28 MED ORDER — ACETAMINOPHEN 500 MG PO TABS: 1000.0000 mg | ORAL_TABLET | Freq: Four times a day (QID) | ORAL | Status: DC

## 2023-11-28 MED ORDER — IBUPROFEN 600 MG PO TABS: 600.0000 mg | ORAL_TABLET | Freq: Four times a day (QID) | ORAL | Status: DC

## 2023-11-28 NOTE — Progress Notes (Signed)
Patient discharged home with infant. Discharge instructions and prescriptions given and reviewed with patient. Patient verbalized understanding. Awaiting escorted out by auxillary.

## 2023-11-28 NOTE — Anesthesia Postprocedure Evaluation (Signed)
Anesthesia Post Note  Patient: Melinda Evans  Procedure(s) Performed: AN AD HOC LABOR EPIDURAL  Patient location during evaluation: Mother Baby Anesthesia Type: Epidural Level of consciousness: awake and alert Pain management: pain level controlled Vital Signs Assessment: post-procedure vital signs reviewed and stable Respiratory status: spontaneous breathing, nonlabored ventilation and respiratory function stable Cardiovascular status: stable Postop Assessment: no headache, no backache and epidural receding Anesthetic complications: no   No notable events documented.   Last Vitals:  Vitals:   11/27/23 2333 11/28/23 0307  BP: 118/67 109/72  Pulse: 72 84  Resp: 18 18  Temp: 37 C 36.7 C  SpO2: 100% 96%    Last Pain:  Vitals:   11/28/23 0650  TempSrc:   PainSc: 1                  Corinda Gubler

## 2023-11-28 NOTE — Lactation Note (Signed)
This note was copied from a baby's chart. Lactation Consultation Note  Patient Name: Melinda Evans Date: 11/28/2023 Age:30 hours Reason for consult: Follow-up assessment;Term   Maternal Data This is mom's 3rd baby, SVD.Mom with history of anxiety. Mom is an experienced breastfeeding mother.  On follow-up visit mom reports baby is "breastfeeding great." Baby with losts of wet and stool diapers. Mom would like a manual pump for home use.  Has patient been taught Hand Expression?: Yes Does the patient have breastfeeding experience prior to this delivery?: Yes How long did the patient breastfeed?: 1 year for each of her 2 previous babies  Feeding Mother's Current Feeding Choice: Breast Milk   Lactation Tools Discussed/Used  Harmony manual breastpump  Interventions Interventions: Hand pump;Coconut oil;Education  Discharge Discharge Education: Engorgement and breast care;Warning signs for feeding baby;Outpatient recommendation Pump:  (Provided manual Harmony Pump)  Consult Status Consult Status: Complete Date: 11/29/23 Follow-up type: In-patient  Update provided to care nurse.  Fuller Song 11/28/2023, 1:19 PM

## 2023-11-28 NOTE — Discharge Instructions (Signed)

## 2023-11-30 ENCOUNTER — Encounter: Payer: Self-pay | Admitting: Advanced Practice Midwife

## 2024-01-08 ENCOUNTER — Ambulatory Visit (INDEPENDENT_AMBULATORY_CARE_PROVIDER_SITE_OTHER): Payer: Medicaid Other | Admitting: Obstetrics

## 2024-01-08 ENCOUNTER — Encounter: Payer: Self-pay | Admitting: Obstetrics

## 2024-01-08 DIAGNOSIS — Z30430 Encounter for insertion of intrauterine contraceptive device: Secondary | ICD-10-CM

## 2024-01-08 DIAGNOSIS — Z3202 Encounter for pregnancy test, result negative: Secondary | ICD-10-CM

## 2024-01-08 LAB — POCT URINE PREGNANCY: Preg Test, Ur: NEGATIVE

## 2024-01-08 MED ORDER — LEVONORGESTREL 20 MCG/DAY IU IUD
1.0000 | INTRAUTERINE_SYSTEM | Freq: Once | INTRAUTERINE | Status: AC
  Administered 2024-01-08: 1 via INTRAUTERINE

## 2024-01-08 NOTE — Progress Notes (Signed)
 OBSTETRICS POSTPARTUM CLINIC PROGRESS NOTE  Subjective:     Arika Mainer is a 33 y.o. [id number] female who presents for a postpartum visit. She is 6 week postpartum following a spontaneous vaginal delivery. I have reviewed the prenatal and intrapartum course. The delivery was at 39 gestational weeks.  Anesthesia: epidural. Postpartum course has been good. Baby's course has been good. Baby is feeding by breast. Bleeding: patient has not not resumed menses, with No LMP recorded.. Bowel function is normal. Bladder function is normal. Patient is not sexually active since delivery. Contraception method desired is Mirena  IUD. Postpartum depression screening: negative.  EDPS score is 0.   The following portions of the patient's history were reviewed and updated as appropriate: allergies, current medications, past family history, past medical history, past social history, past surgical history, and problem list.  Review of Systems Pertinent items are noted in HPI.   Objective:    BP 117/82   Pulse 81   Ht 5' 2 (1.575 m)   Wt 165 lb (74.8 kg)   Breastfeeding Yes   BMI 30.18 kg/m   General:  alert and no distress   Breasts:  inspection negative, no nipple discharge or bleeding, no masses or nodularity palpable  Lungs: clear to auscultation bilaterally  Heart:  regular rate and rhythm, S1, S2 normal, no murmur, click, rub or gallop  Abdomen: soft, non-tender; bowel sounds normal; no masses,  no organomegaly.     Vulva:  normal  Vagina: normal vagina, no discharge, exudate, lesion, or erythema  Cervix:  no cervical motion tenderness and no lesions  Corpus: normal size, contour, position, consistency, mobility, non-tender  Adnexa:  normal adnexa and no mass, fullness, tenderness  Rectal Exam: Not performed.         Labs:  Lab Results  Component Value Date   HGB 11.6 (L) 11/28/2023    IUD Insertion Procedure Note Patient identified, informed consent performed, consent signed.    Discussed risks of irregular bleeding, cramping, infection, malpositioning or misplacement of the IUD outside the uterus which may require further procedure such as laparoscopy. Also discussed >99% contraception efficacy, increased risk of ectopic pregnancy with failure of method.   Emphasized that this did not protect against STIs, condoms recommended during all sexual encounters. Time out was performed.  Urine pregnancy test negative.  Speculum placed in the vagina.  Cervix visualized.  Cleaned with Betadine x 2.  Grasped anteriorly with a single tooth tenaculum.  Uterus sounded to 8 cm.  Mirena  IUD placed per manufacturer's recommendations.  Strings trimmed to 3 cm. Tenaculum was removed, good hemostasis noted.  Patient tolerated procedure well.    Assessment:   1. Encounter for postpartum visit   2. Encounter for IUD insertion      Plan:  33 y.o. [id number] s/p SVD at [redacted]w[redacted]d without complications, doing well. EPDS=0.  -Contraception: Mirena  IUD (see below) -Okay to resume all regular activity as tolerated. -Reviewed returning to intercourse expectations. -Hair loss and pelvic floor function expectations discussed. -Continue PNV as daily multivitamin. -Moods reviewed, alert clinic if developing PPD/A  IUD Insertion:  Mirena  placed without complication.  -Backup method for the next 7 days -Aftercare instructions reviewed, handout provided. Aware IUD protects against pregnancy only, not STIs.  -Discussed how to perform string checks, if cannot find strings, RTC for exam. -Removal in 8 years, sooner if desires pregnancy.  Follow up in 1 year for annual exam, sooner prn  Estil Mangle, DO [facility]

## 2024-01-08 NOTE — Patient Instructions (Addendum)
   IUD AFTERCARE INSTRUCTIONS  Today you may go back to school or work after your visit. You must wait 24 hours after your IUD is put in before you can use tampons, take a bath, or have vaginal sex.  You may have more cramps or heavier bleeding with your periods, or spotting between your periods. This is normal. The cramping and bleeding can last for 3-6 months with the Mirena and [place] IUDs. After 6 months, the cramping and bleeding should get better. Many women will stop having periods after 1 or 2 years with the [place] and [place] IUDs. If you have the Paragard (copper) IUD, you may have more cramping and more bleeding with your periods as long as you have the IUD inside you.  Ibuprofen helps decrease the bleeding and cramping. You can take as many as 4 pills (800 mg) of Ibuprofen every 8 hours with food (each pill contains 200 mg).  Your IUD may come out by itself in the first three months. If you can feel the strings, the IUD is in the right place. If your IUD comes out, you can become pregnant immediately. If you are not sure how to check the strings, we can help you. Meanwhile, use condoms.  Your IUD does not protect against sexually transmitted infections including the HIV virus, genital warts (HPV), gonorrhea, chlamydia, trichomonas, syphilis and herpes. Condoms should be used to decrease the risk of sexually transmitted infections. If you think that you have been exposed to a sexually transmitted infection, please call the clinic. Most infections can be treated WITHOUT removing your IUD.  If you had your IUD placed for birth control, it is effective immediately if it was inserted within five days after the start of your period. If you have it inserted at any other time during your menstrual cycle, use another method of birth control, like condoms for at least 7 days.  Warning Signs Call the clinic if any of the following occurs:  You have fever (over 101F ) or chills.  The implant  comes out or you have concerns about its location.  You have a positive pregnancy test or suspect you might be pregnant.

## 2024-07-31 ENCOUNTER — Other Ambulatory Visit: Payer: Self-pay

## 2024-07-31 ENCOUNTER — Ambulatory Visit: Payer: Self-pay

## 2024-07-31 NOTE — Telephone Encounter (Signed)
 FYI Only or Action Required?: FYI only for provider.  Patient was last seen in primary care on 03/30/2023 by Nicholaus Credit, PA-C.  Called Nurse Triage reporting Anxiety.  Symptoms began a couple of months ago.  Symptoms are: gradually worsening.  Triage Disposition: See PCP When Office is Open (Within 3 Days)  Patient/caregiver understands and will follow disposition?: Yes         Copied from CRM [id number]. Topic: Clinical - Red Word Triage >> Jul 31, 2024 10:15 AM Treva T wrote: [facility] that prompted transfer to Nurse Triage: Received call from patient, she reports previously advised to stop taking medication, Lexapro  by OB/GYN due to pregnancy, patient has now delivered.   Per patient since she has stopped Lexapro  medication she is having increased anxiety, and symptoms of feeling overwhelmed.       Reason for Disposition  MODERATE anxiety (e.g., persistent or frequent anxiety symptoms; interferes with sleep, school, or work)  Answer Assessment - Initial Assessment Questions 1. CONCERN: Did anything happen that prompted you to call today?      Increased anxiety  2. ANXIETY SYMPTOMS: Can you describe how you (your loved one; patient) have been feeling? (e.g., tense, restless, panicky, anxious, keyed up, overwhelmed, sense of impending doom).      Racing thoughts, feeling overwhelmed  3. ONSET: How long have you been feeling this way? (e.g., hours, days, weeks)     A couple of months  4. SEVERITY: How would you rate the level of anxiety? (e.g., 0 - 10; or mild, moderate, severe).     Mild to moderate  5. FUNCTIONAL IMPAIRMENT: How have these feelings affected your ability to do daily activities? Have you had more difficulty than usual doing your normal daily activities? (e.g., getting better, same, worse; self-care, school, work, interactions)     Mild  6. HISTORY: Have you felt this way before? Have you ever been diagnosed with an anxiety problem in the  past? (e.g., generalized anxiety disorder, panic attacks, PTSD). If Yes, ask: How was this problem treated? (e.g., medicines, counseling, etc.)     Yes, history of anxiety, was taking Lexapro  until she was pregnant 7. RISK OF HARM - SUICIDAL IDEATION: Do you ever have thoughts of hurting or killing yourself? If Yes, ask:  Do you have these feelings now? Do you have a plan on how you would do this?     No 8. TREATMENT:  What has been done so far to treat this anxiety? (e.g., medicines, relaxation strategies). What has helped?     No 9. THERAPIST: Do you have a counselor or therapist? If Yes, ask: What is their name?     No 10. POTENTIAL TRIGGERS: Do you drink caffeinated beverages (e.g., coffee, colas, teas), and how much daily? Do you drink alcohol or use any drugs? Have you started any new medicines recently?       Has been off medication  11. PATIENT SUPPORT: Who is with you now? Who do you live with? Do you have family or friends who you can talk to?        Yes 12. OTHER SYMPTOMS: Do you have any other symptoms? (e.g., feeling depressed, trouble concentrating, trouble sleeping, trouble breathing, palpitations or fast heartbeat, chest pain, sweating, nausea, or diarrhea)       No 13. PREGNANCY: Is there any chance you are pregnant? When was your last menstrual period?       No, recently delivered  Protocols used: Anxiety and Panic Attack-A-AH

## 2024-08-01 ENCOUNTER — Encounter: Payer: Self-pay | Admitting: Physician Assistant

## 2024-08-01 ENCOUNTER — Ambulatory Visit (INDEPENDENT_AMBULATORY_CARE_PROVIDER_SITE_OTHER): Admitting: Physician Assistant

## 2024-08-01 VITALS — BP 112/78 | HR 88 | Temp 98.0°F | Resp 18 | Ht 62.0 in | Wt 165.0 lb

## 2024-08-01 DIAGNOSIS — F419 Anxiety disorder, unspecified: Secondary | ICD-10-CM | POA: Diagnosis not present

## 2024-08-01 DIAGNOSIS — R41840 Attention and concentration deficit: Secondary | ICD-10-CM | POA: Diagnosis not present

## 2024-08-01 DIAGNOSIS — R5383 Other fatigue: Secondary | ICD-10-CM | POA: Diagnosis not present

## 2024-08-01 MED ORDER — BUPROPION HCL ER (XL) 150 MG PO TB24: 150.0000 mg | ORAL_TABLET | Freq: Every day | ORAL | 1 refills | Status: DC

## 2024-08-01 NOTE — Addendum Note (Signed)
 Addended by: NICHOLAUS CREDIT on: 08/01/2024 09:58 AM   Modules accepted: Level of Service

## 2024-08-01 NOTE — Progress Notes (Addendum)
 Subjective:  Patient ID: Melinda Evans, female    DOB: 10-07-1991  Age: 33 y.o. MRN: [id number]  Chief Complaint  Patient presents with   Anxiety    HPI  Pt in today with complaints of anxiety - she has had issues in the past and at one time was on lexapro .  She stopped that medication over a year ago and since that time has had a baby that is 50 months old now.  She is breastfeeding as well She states that she feels overwhelmed, anxious and having panic attacks.  She lashes out at her husband at times as well She feels anxious a lot and has trouble concentrating and has issues with ADD - forgetting to do things, forgetting where things are etc She would like to try medication to help all symptoms    08/01/2024    9:27 AM 03/30/2023    8:42 AM 01/03/2023    3:10 PM 11/15/2022    1:43 PM  Depression screen PHQ 2/9  Decreased Interest 0 0 0 0  Down, Depressed, Hopeless 0 0 0 0  PHQ - 2 Score 0 0 0 0  Altered sleeping 2 1 0   Tired, decreased energy 3 0 1   Change in appetite 2 0 1   Feeling bad or failure about yourself  0 0    Trouble concentrating 3 3 3    Moving slowly or fidgety/restless 3 0 0   Suicidal thoughts 0 0 0   PHQ-9 Score 13 4 5    Difficult doing work/chores Very difficult Not difficult at all Somewhat difficult         11/15/2022    1:45 PM 03/30/2023    8:37 AM 08/01/2024    9:27 AM  Fall Risk  Falls in the past year? 0 0 0  Was there an injury with Fall? 0 0 0  Fall Risk Category Calculator 0 0 0  Fall Risk Category (Retired) Low     (RETIRED) Patient Fall Risk Level Low fall risk     Patient at Risk for Falls Due to No Fall Risks No Fall Risks No Fall Risks  Fall risk Follow up Falls evaluation completed  Falls evaluation completed Falls evaluation completed     Data saved with a previous flowsheet row definition     ROS CONSTITUTIONAL: see HPI  CARDIOVASCULAR: Negative for chest pain, dizziness, palpitations and pedal edema.  RESPIRATORY:  Negative for recent cough and dyspnea.  GASTROINTESTINAL: Negative for abdominal pain, acid reflux symptoms, constipation, diarrhea, nausea and vomiting.   INTEGUMENTARY: Negative for rash.  PSYCHIATRIC: see HPI   Current Outpatient Medications:    buPROPion  (WELLBUTRIN  XL) 150 MG 24 hr tablet, Take 1 tablet (150 mg total) by mouth daily., Disp: 30 tablet, Rfl: 1   ibuprofen  (ADVIL ) 600 MG tablet, Take 1 tablet (600 mg total) by mouth every 6 (six) hours. (Patient not taking: Reported on 01/08/2024), Disp: , Rfl:    levonorgestrel  (MIRENA ) 20 MCG/DAY IUD, 1 each by Intrauterine route once., Disp: , Rfl:    prenatal vitamin w/FE, FA (NATACHEW) 29-1 MG CHEW chewable tablet, Chew 2 tablets by mouth daily at 12 noon., Disp: , Rfl:   Past Medical History:  Diagnosis Date   Bladder spasms    Gall stones    GERD (gastroesophageal reflux disease)    Headache(784.0)    History of precipitous labor and delivery 11/27/2023   Positive GBS test 11/12/2023   UTI (lower urinary tract infection)  Objective:  PHYSICAL EXAM:   BP 112/78   Pulse 88   Temp 98 F (36.7 C) (Temporal)   Resp 18   Ht 5' 2 (1.575 m)   Wt 165 lb (74.8 kg)   LMP 05/15/2024 (Approximate)   SpO2 97%   BMI 30.18 kg/m    GEN: Well nourished, well developed, in no acute distress   Cardiac: RRR; no murmurs, rubs,  Respiratory:  normal respiratory rate and pattern with no distress - normal breath sounds with no rales, rhonchi, wheezes or rubs  Skin: warm and dry, no rash  Neuro:  Alert and Oriented x 3, - CN II-Xii grossly intact Psych: euthymic mood, appropriate affect and demeanor  Assessment & Plan:    Other fatigue -     CBC with Differential/Platelet -     Comprehensive metabolic panel with GFR -     TSH  Anxiety -     TSH  Attention or concentration deficit -     buPROPion  HCl ER (XL); Take 1 tablet (150 mg total) by mouth daily.  Dispense: 30 tablet; Refill: 1     Follow-up: Return in about 4  weeks (around 08/29/2024) for follow-up.  An After Visit Summary was printed and given to the patient.  CAMIE JONELLE NICHOLAUS DEVONNA [facility] [phone]

## 2024-08-02 LAB — COMPREHENSIVE METABOLIC PANEL WITH GFR
ALT: 11 IU/L (ref 0–32)
AST: 16 IU/L (ref 0–40)
Albumin: 4.7 g/dL (ref 3.9–4.9)
Alkaline Phosphatase: 129 IU/L — ABNORMAL HIGH (ref 44–121)
BUN/Creatinine Ratio: 19 (ref 9–23)
BUN: 14 mg/dL (ref 6–20)
Bilirubin Total: 0.3 mg/dL (ref 0.0–1.2)
CO2: 20 mmol/L (ref 20–29)
Calcium: 9.8 mg/dL (ref 8.7–10.2)
Chloride: 105 mmol/L (ref 96–106)
Creatinine, Ser: 0.73 mg/dL (ref 0.57–1.00)
Globulin, Total: 2.2 g/dL (ref 1.5–4.5)
Glucose: 85 mg/dL (ref 70–99)
Potassium: 4.1 mmol/L (ref 3.5–5.2)
Sodium: 144 mmol/L (ref 134–144)
Total Protein: 6.9 g/dL (ref 6.0–8.5)
eGFR: 113 mL/min/1.73 (ref >59)

## 2024-08-02 LAB — CBC WITH DIFFERENTIAL/PLATELET
Basophils Absolute: 0.1 x10E3/uL (ref 0.0–0.2)
Basos: 1 %
EOS (ABSOLUTE): 0.1 x10E3/uL (ref 0.0–0.4)
Eos: 2 %
Hematocrit: 42.6 % (ref 34.0–46.6)
Hemoglobin: 14.2 g/dL (ref 11.1–15.9)
Immature Grans (Abs): 0 x10E3/uL (ref 0.0–0.1)
Immature Granulocytes: 0 %
Lymphocytes Absolute: 1.8 x10E3/uL (ref 0.7–3.1)
Lymphs: 30 %
MCH: 32.1 pg (ref 26.6–33.0)
MCHC: 33.3 g/dL (ref 31.5–35.7)
MCV: 96 fL (ref 79–97)
Monocytes Absolute: 0.5 x10E3/uL (ref 0.1–0.9)
Monocytes: 8 %
Neutrophils Absolute: 3.6 x10E3/uL (ref 1.4–7.0)
Neutrophils: 59 %
Platelets: 267 x10E3/uL (ref 150–450)
RBC: 4.42 x10E6/uL (ref 3.77–5.28)
RDW: 11.9 % (ref 11.7–15.4)
WBC: 6 x10E3/uL (ref 3.4–10.8)

## 2024-08-02 LAB — TSH: TSH: 0.729 u[IU]/mL (ref 0.450–4.500)

## 2024-08-04 ENCOUNTER — Ambulatory Visit: Payer: Self-pay | Admitting: Physician Assistant

## 2024-09-02 ENCOUNTER — Encounter: Payer: Self-pay | Admitting: Physician Assistant

## 2024-09-02 ENCOUNTER — Ambulatory Visit (INDEPENDENT_AMBULATORY_CARE_PROVIDER_SITE_OTHER): Admitting: Physician Assistant

## 2024-09-02 VITALS — BP 110/76 | HR 86 | Temp 98.2°F | Resp 18 | Ht 62.0 in | Wt 161.8 lb

## 2024-09-02 DIAGNOSIS — R41840 Attention and concentration deficit: Secondary | ICD-10-CM

## 2024-09-02 DIAGNOSIS — F419 Anxiety disorder, unspecified: Secondary | ICD-10-CM

## 2024-09-02 MED ORDER — BUPROPION HCL ER (XL) 300 MG PO TB24: 300.0000 mg | ORAL_TABLET | Freq: Every day | ORAL | 3 refills | Status: AC

## 2024-09-02 NOTE — Progress Notes (Signed)
 Subjective:  Patient ID: Melinda Evans, female    DOB: 1990/12/20  Age: 33 y.o. MRN: [id number]  Chief Complaint  Patient presents with   Anxiety    HPI Pt in today for follow up of anxiety - states she is doing well with wellbutrin  and can tell it has helped her anxiety a bit but has not really made a change in her attention span.   She is not having any current side effects from the medication     09/02/2024    1:19 PM 08/01/2024    9:27 AM 03/30/2023    8:42 AM 01/03/2023    3:10 PM 11/15/2022    1:43 PM  Depression screen PHQ 2/9  Decreased Interest 0 0 0 0 0  Down, Depressed, Hopeless 0 0 0 0 0  PHQ - 2 Score 0 0 0 0 0  Altered sleeping 0 2 1 0   Tired, decreased energy 0 3 0 1   Change in appetite 0 2 0 1   Feeling bad or failure about yourself  0 0 0    Trouble concentrating 0 3 3 3    Moving slowly or fidgety/restless 0 3 0 0   Suicidal thoughts 0 0 0 0   PHQ-9 Score 0 13 4 5    Difficult doing work/chores Not difficult at all Very difficult Not difficult at all Somewhat difficult         11/15/2022    1:45 PM 03/30/2023    8:37 AM 08/01/2024    9:27 AM  Fall Risk  Falls in the past year? 0 0 0  Was there an injury with Fall? 0 0 0  Fall Risk Category Calculator 0 0 0  Fall Risk Category (Retired) Low     (RETIRED) Patient Fall Risk Level Low fall risk     Patient at Risk for Falls Due to No Fall Risks No Fall Risks No Fall Risks  Fall risk Follow up Falls evaluation completed  Falls evaluation completed Falls evaluation completed     Data saved with a previous flowsheet row definition     ROS CONSTITUTIONAL: Negative for chills, fatigue, fever,   CARDIOVASCULAR: Negative for chest pain, RESPIRATORY: Negative for recent cough and dyspnea.   PSYCHIATRIC: see HPI   Current Outpatient Medications:    buPROPion  (WELLBUTRIN  XL) 300 MG 24 hr tablet, Take 1 tablet (300 mg total) by mouth daily., Disp: 30 tablet, Rfl: 3   levonorgestrel  (MIRENA ) 20 MCG/DAY  IUD, 1 each by Intrauterine route once., Disp: , Rfl:    prenatal vitamin w/FE, FA (NATACHEW) 29-1 MG CHEW chewable tablet, Chew 2 tablets by mouth daily at 12 noon., Disp: , Rfl:    ibuprofen  (ADVIL ) 600 MG tablet, Take 1 tablet (600 mg total) by mouth every 6 (six) hours. (Patient not taking: Reported on 01/08/2024), Disp: , Rfl:   Past Medical History:  Diagnosis Date   Bladder spasms    Gall stones    GERD (gastroesophageal reflux disease)    Headache(784.0)    History of precipitous labor and delivery 11/27/2023   Positive GBS test 11/12/2023   UTI (lower urinary tract infection)    Objective:  PHYSICAL EXAM:   BP 110/76   Pulse 86   Temp 98.2 F (36.8 C) (Temporal)   Resp 18   Ht 5' 2 (1.575 m)   Wt 161 lb 12.8 oz (73.4 kg)   LMP  (LMP Unknown)   SpO2 97%   Breastfeeding Yes  BMI 29.59 kg/m    GEN: Well nourished, well developed, in no acute distress  Cardiac: RRR; no murmurs, rubs, Respiratory:  normal respiratory rate and pattern with no distress - normal breath sounds with no rales, rhonchi, wheezes or rubs  Psych: euthymic mood, appropriate affect and demeanor  Assessment & Plan:    Anxiety -     buPROPion  HCl ER (XL); Take 1 tablet (300 mg total) by mouth daily.  Dispense: 30 tablet; Refill: 3  Attention or concentration deficit -     buPROPion  HCl ER (XL); Take 1 tablet (300 mg total) by mouth daily.  Dispense: 30 tablet; Refill: 3     Follow-up: Return in about 4 months (around 01/02/2025) for follow-up.  An After Visit Summary was printed and given to the patient.  Melinda Evans [facility] [phone]

## 2024-10-27 ENCOUNTER — Ambulatory Visit

## 2024-10-27 NOTE — Progress Notes (Signed)
 Patient is in office today for a nurse visit for PPD. Patient Injection was given in the  Right arm. Patient tolerated injection well. Patient will return on Wednesday for TB reading.

## 2024-10-29 ENCOUNTER — Ambulatory Visit

## 2024-10-29 ENCOUNTER — Ambulatory Visit (INDEPENDENT_AMBULATORY_CARE_PROVIDER_SITE_OTHER)

## 2024-10-29 DIAGNOSIS — A150 Tuberculosis of lung: Secondary | ICD-10-CM

## 2024-10-29 DIAGNOSIS — Z111 Encounter for screening for respiratory tuberculosis: Secondary | ICD-10-CM

## 2024-10-29 LAB — TB SKIN TEST
Induration: 0 mm
TB Skin Test: NEGATIVE

## 2024-10-29 NOTE — Progress Notes (Signed)
 Patient is in office today for a nurse visit for PPD. Patient PPD Reading Note PPD read and results entered in EpicCare. Result: 0 mm induration. Interpretation: NEGATIVE If test not read within 48-72 hours of initial placement, patient advised to repeat in other arm 1-3 weeks after this test. Allergic reaction: NO

## 2024-11-20 ENCOUNTER — Other Ambulatory Visit: Payer: Self-pay

## 2024-11-20 ENCOUNTER — Emergency Department (HOSPITAL_BASED_OUTPATIENT_CLINIC_OR_DEPARTMENT_OTHER)
Admission: EM | Admit: 2024-11-20 | Discharge: 2024-11-20 | Disposition: A | Discharge status: Home / Self Care | Attending: Emergency Medicine | Admitting: Emergency Medicine

## 2024-11-20 ENCOUNTER — Ambulatory Visit: Payer: Self-pay

## 2024-11-20 ENCOUNTER — Encounter (HOSPITAL_BASED_OUTPATIENT_CLINIC_OR_DEPARTMENT_OTHER): Payer: Self-pay | Admitting: Emergency Medicine

## 2024-11-20 DIAGNOSIS — R42 Dizziness and giddiness: Secondary | ICD-10-CM | POA: Diagnosis present

## 2024-11-20 DIAGNOSIS — R002 Palpitations: Secondary | ICD-10-CM | POA: Diagnosis not present

## 2024-11-20 LAB — COMPREHENSIVE METABOLIC PANEL WITH GFR
ALT: 13 U/L (ref 0–44)
AST: 18 U/L (ref 15–41)
Albumin: 4.9 g/dL (ref 3.5–5.0)
Alkaline Phosphatase: 131 U/L — ABNORMAL HIGH (ref 38–126)
Anion gap: 15 (ref 5–15)
BUN: 18 mg/dL (ref 6–20)
CO2: 22 mmol/L (ref 22–32)
Calcium: 9.6 mg/dL (ref 8.9–10.3)
Chloride: 103 mmol/L (ref 98–111)
Creatinine, Ser: 0.72 mg/dL (ref 0.44–1.00)
GFR, Estimated: >60 mL/min (ref >60)
Glucose, Bld: 76 mg/dL (ref 70–99)
Potassium: 4.1 mmol/L (ref 3.5–5.1)
Sodium: 140 mmol/L (ref 135–145)
Total Bilirubin: 0.6 mg/dL (ref 0.0–1.2)
Total Protein: 7.4 g/dL (ref 6.5–8.1)

## 2024-11-20 LAB — CBC
HCT: 42 % (ref 36.0–46.0)
Hemoglobin: 14.7 g/dL (ref 12.0–15.0)
MCH: 32 pg (ref 26.0–34.0)
MCHC: 35 g/dL (ref 30.0–36.0)
MCV: 91.5 fL (ref 80.0–100.0)
Platelets: 272 K/uL (ref 150–400)
RBC: 4.59 MIL/uL (ref 3.87–5.11)
RDW: 12.2 % (ref 11.5–15.5)
WBC: 6.9 K/uL (ref 4.0–10.5)
nRBC: 0 % (ref 0.0–0.2)

## 2024-11-20 LAB — RESP PANEL BY RT-PCR (RSV, FLU A&B, COVID)  RVPGX2
Influenza A by PCR: NEGATIVE
Influenza B by PCR: NEGATIVE
Resp Syncytial Virus by PCR: NEGATIVE
SARS Coronavirus 2 by RT PCR: NEGATIVE

## 2024-11-20 LAB — URINALYSIS, ROUTINE W REFLEX MICROSCOPIC
Bilirubin Urine: NEGATIVE
Glucose, UA: NEGATIVE mg/dL
Hgb urine dipstick: NEGATIVE
Ketones, ur: 15 mg/dL — AB
Nitrite: NEGATIVE
Protein, ur: NEGATIVE mg/dL
Specific Gravity, Urine: 1.015 (ref 1.005–1.030)
pH: 7 (ref 5.0–8.0)

## 2024-11-20 LAB — URINALYSIS, MICROSCOPIC (REFLEX)

## 2024-11-20 LAB — TROPONIN T, HIGH SENSITIVITY: Troponin T High Sensitivity: <15 ng/L (ref 0–19)

## 2024-11-20 LAB — PREGNANCY, URINE: Preg Test, Ur: NEGATIVE

## 2024-11-20 NOTE — ED Triage Notes (Signed)
 Pt c/o intermittent cold sweats, palpitations, BLLE weakness, dizziness/light headed x 1.5 weeks.    Reports good po intake. Denies changes in medication, cardiac hx, diabetes.

## 2024-11-20 NOTE — ED Notes (Signed)
 ..  The patient is A&OX4, ambulatory at d/c with independent steady gait, NAD. Pt verbalized understanding of d/c instructions and follow up care.

## 2024-11-20 NOTE — ED Notes (Signed)
 Pt provided apple juice and graham crackers for cbg of 73.

## 2024-11-20 NOTE — Telephone Encounter (Signed)
 FYI Only or Action Required?: FYI only for provider: ED advised.  Patient was last seen in primary care on 09/02/2024 by Nicholaus Credit, PA-C.  Called Nurse Triage reporting Dizziness.  Symptoms began a week ago.  Interventions attempted: Rest, hydration, or home remedies and Dietary changes.  Symptoms are: gradually worsening.  Triage Disposition: Go to ED Now (or PCP Triage)  Patient/caregiver understands and will follow disposition?: Yes  Copied from CRM [id number]. Topic: Clinical - Red Word Triage >> Nov 20, 2024 11:13 AM Leonette SQUIBB wrote: Red Word that prompted transfer to Nurse Triage: heart racing, dizzy and weak legs spells for about a week or two. Reason for Disposition  Extra heartbeats, irregular heart beating, or heart is beating very fast  (i.e., palpitations)  Answer Assessment - Initial Assessment Questions Cold sweats, dizziness, heart racing, legs feel heavy/weak, feel energy just drains out of you. Clumsy. Denies CP or SOB, fever.  Randomly- about 4 episodes a day- few minutes to about long. No correlation to going from sitting to standing.   Only happened with missing meals in the past- this feels worse because eating hasn't improved things  Advised patient would need to be evaluated in the ED for these symptoms. Will call back to schedule HFU   1. DESCRIPTION: Describe your dizziness.     Cold sweats dizziness 2. LIGHTHEADED: Do you feel lightheaded? (e.g., somewhat faint, woozy, weak upon standing)     Woozy, tunnel vision- if she doesn't sit feels like she will fall out 3. VERTIGO: Do you feel like either you or the room is spinning or tilting? (i.e., vertigo)     denies 4. SEVERITY: How bad is it?  Do you feel like you are going to faint? Can you stand and walk?     If she doesn't sit down then she might pass out  5. ONSET:  When did the dizziness begin?     One week  6. AGGRAVATING FACTORS: Does anything make it worse? (e.g., standing,  change in head position)     Unsure- random 7. HEART RATE: Can you tell me your heart rate? How many beats in 15 seconds?  (Note: Not all patients can do this.)       Feels like heart is beating hard- like with anxiouness but currently managed well 8. CAUSE: What do you think is causing the dizziness? (e.g., decreased fluids or food, diarrhea, emotional distress, heat exposure, new medicine, sudden standing, vomiting; unknown)     Thought it was due to food- but increased intake and not improved.  9. RECURRENT SYMPTOM: Have you had dizziness before? If Yes, ask: When was the last time? What happened that time?     Last episode right before call- ago 10. OTHER SYMPTOMS: Do you have any other symptoms? (e.g., fever, chest pain, vomiting, diarrhea, bleeding)       Nausea, appetite decreased. 11. PREGNANCY: Is there any chance you are pregnant? When was your last menstrual period?       IUD  Protocols used: Dizziness - Lightheadedness-A-AH

## 2024-11-20 NOTE — ED Provider Notes (Signed)
 [place] Provider Note   CSN: [id number] Arrival date & time: 11/20/24  1358     Patient presents with: Dizziness   Melinda Evans is a 33 y.o. female with past medical history of migraines who presents emergency department for dizziness.  Patient reports her symptoms started approximately 1-1/2 weeks ago and comes in waves.  She states she feels cold sweats, palpitations and dizziness, that prompts her to sit down.  She also reports a right-sided headache with these episodes.  Patient has a history of migraines, but these headaches feel different than her usual migraines.  She denies any LOC and has not fallen.  She denies any nausea or visual changes.  Patient has not taken any medicine for her symptoms.  She reports they typically go away after 30 minutes.  She does not have any cardiac history that she is aware of.    Dizziness      Prior to Admission medications  Medication Sig Start Date End Date Taking? Authorizing Provider  buPROPion  (WELLBUTRIN  XL) 300 MG 24 hr tablet Take 1 tablet (300 mg total) by mouth daily. 09/02/24   Nicholaus Credit, PA-C  levonorgestrel  (MIRENA ) 20 MCG/DAY IUD 1 each by Intrauterine route once. 01/08/24   [provider]  prenatal vitamin w/FE, FA (NATACHEW) 29-1 MG CHEW chewable tablet Chew 2 tablets by mouth daily at 12 noon.    [provider]    Allergies: Patient has no known allergies.    Review of Systems  Neurological:  Positive for dizziness.    Updated Vital Signs BP 112/84   Pulse 90   Temp 98.4 F (36.9 C) (Oral)   Resp 20   Ht 5' 2 (1.575 m)   Wt 69.9 kg   SpO2 97%   Breastfeeding Yes   BMI 28.17 kg/m   Physical Exam Vitals and nursing note reviewed.  Constitutional:      Appearance: Normal appearance.  HENT:     Head: Normocephalic and atraumatic.     Mouth/Throat:     Mouth: Mucous membranes are moist.  Eyes:     General: No scleral icterus.        Right eye: No discharge.        Left eye: No discharge.     Conjunctiva/sclera: Conjunctivae normal.  Cardiovascular:     Rate and Rhythm: Normal rate and regular rhythm.     Pulses: Normal pulses.  Pulmonary:     Effort: Pulmonary effort is normal.     Breath sounds: Normal breath sounds.  Abdominal:     General: There is no distension.     Tenderness: There is no abdominal tenderness.  Musculoskeletal:        General: No deformity.     Cervical back: Normal range of motion.  Skin:    General: Skin is warm and dry.     Capillary Refill: Capillary refill takes less than 2 seconds.  Neurological:     Mental Status: She is alert.     Motor: No weakness.     Comments: Patient speaks in full goal oriented sentences. Cranial nerves 3-12 grossly intact. DTRs normal and symmetric. Equal grip strength bilateral with 5/5 strength against resistance in upper and lower extremities. No sensory or motor deficits appreciated.   Psychiatric:        Mood and Affect: Mood normal.     (all labs ordered are listed, but only abnormal results are displayed) Labs Reviewed  COMPREHENSIVE METABOLIC PANEL WITH GFR - Abnormal; Notable for the following components:      Result Value   Alkaline Phosphatase 131 (*)    All other components within normal limits  URINALYSIS, ROUTINE W REFLEX MICROSCOPIC - Abnormal; Notable for the following components:   APPearance HAZY (*)    Ketones, ur 15 (*)    Leukocytes,Ua MODERATE (*)    All other components within normal limits  URINALYSIS, MICROSCOPIC (REFLEX) - Abnormal; Notable for the following components:   Bacteria, UA MANY (*)    All other components within normal limits  RESP PANEL BY RT-PCR (RSV, FLU A&B, COVID)  RVPGX2  CBC  PREGNANCY, URINE  CBG MONITORING, ED  TROPONIN T, HIGH SENSITIVITY    EKG: None  Radiology: No results found.  Procedures   Medications Ordered in the ED - No data to display                                Medical  Decision Making Amount and/or Complexity of Data Reviewed Labs: ordered.   This patient presents to the ED for concern of dizziness, this involves an extensive number of treatment options, and is a complaint that carries with it a high risk of complications and morbidity.  Differential diagnosis includes: Vertigo, BPPV, TIA, cardiac arrhythmia, electrolyte abnormality, migraine  Co morbidities:  history of migraines  Lab Tests:  I Ordered, and personally interpreted labs.  The pertinent results include: No acute abnormalities to explain patient's symptoms  -UA: Moderate leukocytes, many bacteria and 21-50 WBC  Imaging Studies:  Imaging not indicated at this time  Cardiac Monitoring/ECG:  The patient was maintained on a cardiac monitor.  I personally viewed and interpreted the cardiac monitored which showed an underlying rhythm of: Sinus rhythm  Medicines ordered and prescription drug management:  Medication not indicated  Test Considered:   none  Critical Interventions:   none  Consultations Obtained: None  Problem List / ED Course:     ICD-10-CM   1. Dizziness  R42 Ambulatory referral to Cardiology    2. Palpitations  R00.2 Ambulatory referral to Cardiology      MDM: 33 year old female who presents emergency department for evaluation of intermittent dizziness and palpitations.  Symptoms ongoing on and off for the last 1-1/2 weeks.  Cardiac workup in the emergency department was negative.  No electrolyte abnormality to explain patient's symptoms.  Her UA was positive for moderate leukocytes and 21-50 WBCs, however patient has no symptoms.  Opted to not treat patient for possible UTI, but did provide her with recommendation to follow-up with her PCP should she develop dysuria or hematuria.  I do feel patient warrants further workup with cardiology such as a Zio patch and/or Holter monitor for possible cardiac arrhythmia.  I did provide her with an ambulatory referral  to cardiology as well as their contact information if she does not hear from them.  Patient verbalized understanding to these instructions.  Her vital signs are stable.  Patient is appropriate for discharge at this time.  Dispostion:  After consideration of the diagnostic results and the patients response to treatment, I feel that the patient would benefit from ambulatory cardiology follow-up.   Final diagnoses:  Dizziness  Palpitations    ED Discharge Orders          Ordered    Ambulatory referral to Cardiology        11/20/24 1545  Torrence Marry RAMAN, PA-C 11/20/24 1814    Tegeler, Lonni PARAS, MD 11/20/24 1944

## 2024-11-20 NOTE — Discharge Instructions (Signed)
 It was a pleasure taking care of you today. You were seen in the Emergency Department for evaluation of dizziness and palpitations. Your work-up was reassuring. Your lab work and EKG were unremarkable.  As discussed, I do think it would be beneficial for you to seek follow-up with cardiology for further evaluation.  I have given you an ambulatory referral to the [facility] here at [facility].  They should call you to schedule an appointment when the next 1-2 business days.  If you do not hear from them, please call them to schedule an appointment and let them know you were given a referral.  Their contact information is provided on your discharge paperwork.  Refer to the attached documentation for further management of your symptoms.   Please return to the ER if you experience chest pain, trouble breathing, intractable nausea/vomiting or any other life threatening illnesses.

## 2024-11-21 LAB — CBG MONITORING, ED: Glucose-Capillary: 73 mg/dL (ref 70–99)

## 2024-11-28 ENCOUNTER — Ambulatory Visit

## 2024-11-28 ENCOUNTER — Encounter: Payer: Self-pay | Admitting: Cardiology

## 2024-11-28 ENCOUNTER — Ambulatory Visit: Attending: Cardiology | Admitting: Cardiology

## 2024-11-28 VITALS — BP 115/80 | HR 85 | Ht 62.0 in | Wt 161.0 lb

## 2024-11-28 DIAGNOSIS — F419 Anxiety disorder, unspecified: Secondary | ICD-10-CM

## 2024-11-28 DIAGNOSIS — R42 Dizziness and giddiness: Secondary | ICD-10-CM

## 2024-11-28 DIAGNOSIS — R002 Palpitations: Secondary | ICD-10-CM | POA: Diagnosis present

## 2024-11-28 DIAGNOSIS — R55 Syncope and collapse: Secondary | ICD-10-CM | POA: Diagnosis not present

## 2024-11-28 NOTE — Addendum Note (Signed)
 Addended by: ONEITA BERLINER on: 11/28/2024 03:18 PM   Modules accepted: Orders

## 2024-11-28 NOTE — Patient Instructions (Signed)
 Medication Instructions:  Your physician recommends that you continue on your current medications as directed. Please refer to the Current Medication list given to you today.  *If you need a refill on your cardiac medications before your next appointment, please call your pharmacy*   Lab Work: None ordered If you have labs (blood work) drawn today and your tests are completely normal, you will receive your results only by: MyChart Message (if you have MyChart) OR A paper copy in the mail If you have any lab test that is abnormal or we need to change your treatment, we will call you to review the results.  Testing/Procedures: Your physician has requested that you have an echocardiogram. Echocardiography is a painless test that uses sound waves to create images of your heart. It provides your doctor with information about the size and shape of your heart and how well your hearts chambers and valves are working. This procedure takes approximately one hour. There are no restrictions for this procedure. Please do NOT wear cologne, perfume, aftershave, or lotions (deodorant is allowed). Please arrive 15 minutes prior to your appointment time.  Please note: We ask at that you not bring children with you during ultrasound (echo/ vascular) testing. Due to room size and safety concerns, children are not allowed in the ultrasound rooms during exams. Our front office staff cannot provide observation of children in our lobby area while testing is being conducted. An adult accompanying a patient to their appointment will only be allowed in the ultrasound room at the discretion of the ultrasound technician under special circumstances. We apologize for any inconvenience.  A zio monitor was ordered today. It will remain on for 14 days. Remove 12/12/2024. You will then return monitor and event diary in provided box. It takes 1-2 weeks for report to be downloaded and returned to us . We will call you with the  results. If monitor falls off or has orange flashing light, please call Zio for further instructions.   Follow-Up: At [facility], you and your health needs are our priority.  As part of our continuing mission to provide you with exceptional heart care, we have created designated Provider Care Teams.  These Care Teams include your primary Cardiologist (physician) and Advanced Practice Providers (APPs -  Physician Assistants and Nurse Practitioners) who all work together to provide you with the care you need, when you need it.  We recommend signing up for the patient portal called MyChart.  Sign up information is provided on this After Visit Summary.  MyChart is used to connect with patients for Virtual Visits (Telemedicine).  Patients are able to view lab/test results, encounter notes, upcoming appointments, etc.  Non-urgent messages can be sent to your provider as well.   To learn more about what you can do with MyChart, go to [email or web address].    Your next appointment:   2 month(s)  The format for your next appointment:   In Person  Provider:   Lamar Fitch, MD   Other Instructions Echocardiogram An echocardiogram is a test that uses sound waves (ultrasound) to produce images of the heart. Images from an echocardiogram can provide important information about: Heart size and shape. The size and thickness and movement of your heart's walls. Heart muscle function and strength. Heart valve function or if you have stenosis. Stenosis is when the heart valves are too narrow. If blood is flowing backward through the heart valves (regurgitation). A tumor or infectious growth around the heart valves. Areas of heart  muscle that are not working well because of poor blood flow or injury from a heart attack. Aneurysm detection. An aneurysm is a weak or damaged part of an artery wall. The wall bulges out from the normal force of blood pumping through the body. Tell a health care  provider about: Any allergies you have. All medicines you are taking, including vitamins, herbs, eye drops, creams, and over-the-counter medicines. Any blood disorders you have. Any surgeries you have had. Any medical conditions you have. Whether you are pregnant or may be pregnant. What are the risks? Generally, this is a safe test. However, problems may occur, including an allergic reaction to dye (contrast) that may be used during the test. What happens before the test? No specific preparation is needed. You may eat and drink normally. What happens during the test? You will take off your clothes from the waist up and put on a hospital gown. Electrodes or electrocardiogram (ECG)patches may be placed on your chest. The electrodes or patches are then connected to a device that monitors your heart rate and rhythm. You will lie down on a table for an ultrasound exam. A gel will be applied to your chest to help sound waves pass through your skin. A handheld device, called a transducer, will be pressed against your chest and moved over your heart. The transducer produces sound waves that travel to your heart and bounce back (or echo back) to the transducer. These sound waves will be captured in real-time and changed into images of your heart that can be viewed on a video monitor. The images will be recorded on a computer and reviewed by your health care provider. You may be asked to change positions or hold your breath for a short time. This makes it easier to get different views or better views of your heart. In some cases, you may receive contrast through an IV in one of your veins. This can improve the quality of the pictures from your heart. The procedure may vary among health care providers and hospitals.   What can I expect after the test? You may return to your normal, everyday life, including diet, activities, and medicines, unless your health care provider tells you not to do that. Follow  these instructions at home: It is up to you to get the results of your test. Ask your health care provider, or the department that is doing the test, when your results will be ready. Keep all follow-up visits. This is important. Summary An echocardiogram is a test that uses sound waves (ultrasound) to produce images of the heart. Images from an echocardiogram can provide important information about the size and shape of your heart, heart muscle function, heart valve function, and other possible heart problems. You do not need to do anything to prepare before this test. You may eat and drink normally. After the echocardiogram is completed, you may return to your normal, everyday life, unless your health care provider tells you not to do that. This information is not intended to replace advice given to you by your health care provider. Make sure you discuss any questions you have with your health care provider. Document Revised: 07/13/2020 Document Reviewed: 07/13/2020 Elsevier Patient Education  2021 [employer]   Important Information About Sugar

## 2024-11-28 NOTE — Progress Notes (Signed)
 "  Cardiology Consultation:    Date:  11/28/2024   ID:  Verdis Bassette, DOB 1991/08/20, MRN [id number]  PCP:  Nicholaus Credit, PA-C  Cardiologist:  Lamar Fitch, MD   Referring MD: Torrence Marry RAMAN, PA-C   Chief Complaint  Patient presents with   Dizziness   Palpitations    History of Present Illness:    Melinda Evans is a 33 y.o. female who is being seen today for the evaluation of palpitations and dizziness at the request of Torrence Marry RAMAN, PA-C.  Past medical history significant for GERD, headaches, anxiety, she was referred to us  because of episode of dizziness.  She describes to assurance that she could be standing started feeling swimmy headed and she would feel her heart speeding up somewhat sometimes even nauseated when she sit down or lay down the sensation goes away she had it for years but very mild but lately became more frequent.  She does have 3 children she have younger's daughter right now that she is breast-feeding.  She never passed fell down because of that she never fell down because of this, denies have any chest pain tightness squeezing pressure burning chest.  But described to have some palpitation symptoms she can feel her heart skipping.  Does not smoke never did no alcohol does have family history of premature coronary artery disease  Past Medical History:  Diagnosis Date   Bladder spasms    Gall stones    GERD (gastroesophageal reflux disease)    Headache(784.0)    History of precipitous labor and delivery 11/27/2023   Positive GBS test 11/12/2023   UTI (lower urinary tract infection)     Past Surgical History:  Procedure Laterality Date   TONSILLECTOMY     WISDOM TOOTH EXTRACTION      Current Medications: Active Medications[1]   Allergies:   Patient has no known allergies.   Social History   Socioeconomic History   Marital status: Married    Spouse name: Nurse, Children's   Number of children: 2   Years of education: 12   Highest  education level: 12th grade  Occupational History   Occupation: print production planner  Tobacco Use   Smoking status: Never   Smokeless tobacco: Never  Vaping Use   Vaping status: Never Used  Substance and Sexual Activity   Alcohol use: No   Drug use: No   Sexual activity: Yes    Partners: Male    Birth control/protection: None  Other Topics Concern   Not on file  Social History Narrative   Not on file   Social Drivers of Health   Tobacco Use: Low Risk (11/28/2024)   Patient History    Smoking Tobacco Use: Never    Smokeless Tobacco Use: Never    Passive Exposure: Not on file  Financial Resource Strain: Medium Risk (07/31/2024)   Overall Financial Resource Strain (CARDIA)    Difficulty of Paying Living Expenses: Somewhat hard  Food Insecurity: No Food Insecurity (07/31/2024)   Epic    Worried About Radiation Protection Practitioner of Food in the Last Year: Never true    Ran Out of Food in the Last Year: Never true  Transportation Needs: No Transportation Needs (07/31/2024)   Epic    Lack of Transportation (Medical): No    Lack of Transportation (Non-Medical): No  Physical Activity: Inactive (07/31/2024)   Exercise Vital Sign    Days of Exercise per Week: 0 days    Minutes of Exercise per Session: Not on  file  Stress: Stress Concern Present (07/31/2024)   [employer] of Occupational Health - Occupational Stress Questionnaire    Feeling of Stress: Rather much  Social Connections: Moderately Integrated (07/31/2024)   Social Connection and Isolation Panel    Frequency of Communication with Friends and Family: More than three times a week    Frequency of Social Gatherings with Friends and Family: Once a week    Attends Religious Services: More than 4 times per year    Active Member of Clubs or Organizations: No    Attends Engineer, Structural: Not on file    Marital Status: Married  Depression (PHQ2-9): Low Risk (09/02/2024)   Depression (PHQ2-9)    PHQ-2 Score: 0  Recent Concern:  Depression (PHQ2-9) - High Risk (08/01/2024)   Depression (PHQ2-9)    PHQ-2 Score: 13  Alcohol Screen: Not on file  Housing: High Risk (07/31/2024)   Epic    Unable to Pay for Housing in the Last Year: Yes    Number of Times Moved in the Last Year: 0    Homeless in the Last Year: No  Utilities: Not At Risk (11/27/2023)   [facility] Utilities    Threatened with loss of utilities: No  Health Literacy: Not on file     Family History: The patient's family history includes Diabetes in her maternal grandmother, mother, and paternal grandfather; Healthy in her father, paternal grandmother, and sister; Heart disease in her maternal grandfather; Hypertension in her maternal grandmother and paternal aunt. ROS:   Please see the history of present illness.    All 14 point review of systems negative except as described per history of present illness.  EKGs/Labs/Other Studies Reviewed:    The following studies were reviewed today:   EKG:  EKG Interpretation Date/Time:  Friday November 28 2024 14:44:48 EST Ventricular Rate:  85 PR Interval:  150 QRS Duration:  76 QT Interval:  360 QTC Calculation: 428 R Axis:   34  Text Interpretation: Normal sinus rhythm Nonspecific T wave abnormality When compared with ECG of 20-Nov-2024 16:07, PREVIOUS ECG IS PRESENT Confirmed by Bernie Charleston [phone]) on 11/28/2024 2:53:51 PM    Recent Labs: 08/01/2024: TSH 0.729 11/20/2024: ALT 13; BUN 18; Creatinine, Ser 0.72; Hemoglobin 14.7; Platelets 272; Potassium 4.1; Sodium 140  Recent Lipid Panel No results found for: CHOL, TRIG, HDL, CHOLHDL, VLDL, LDLCALC, LDLDIRECT  Physical Exam:    VS:  BP 115/80   Pulse 85   Ht 5' 2 (1.575 m)   Wt 161 lb (73 kg)   SpO2 98%   BMI 29.45 kg/m     Wt Readings from Last 3 Encounters:  11/28/24 161 lb (73 kg)  11/20/24 154 lb (69.9 kg)  09/02/24 161 lb 12.8 oz (73.4 kg)     GEN:  Well nourished, well developed in no acute distress HEENT: Normal NECK:  No JVD; No carotid bruits LYMPHATICS: No lymphadenopathy CARDIAC: RRR, no murmurs, no rubs, no gallops RESPIRATORY:  Clear to auscultation without rales, wheezing or rhonchi  ABDOMEN: Soft, non-tender, non-distended MUSCULOSKELETAL:  No edema; No deformity  SKIN: Warm and dry NEUROLOGIC:  Alert and oriented x 3 PSYCHIATRIC:  Normal affect   ASSESSMENT:    1. Palpitations   2. Dizziness   3. Anxiety   4. Vasovagal reaction    PLAN:    In order of problems listed above:  Vasovagal reaction: I described to her the mechanism of this ask her to stay well-hydrated also liberate some salt intake,  I also demonstrated counterpressure maneuver for her and ask her to sit down or lay down when she feel the sensation is coming but the key will be trying to prevent the point that vasovagal reactions being triggered. Palpitations: I will ask her to wear Zio patch for 2 weeks to make sure she does not have any significant arrhythmia even though from description she gave me a doubt very much. As a part of her evaluation echocardiogram will be done to assess left ventricular ejection fraction. Anxiety probably contributing   Medication Adjustments/Labs and Tests Ordered: Current medicines are reviewed at length with the patient today.  Concerns regarding medicines are outlined above.  Orders Placed This Encounter  Procedures   EKG 12-Lead   No orders of the defined types were placed in this encounter.   Signed, Lamar DOROTHA Fitch, MD, [facility] 11/28/2024 3:08 PM    [facility]    [1]  Current Meds  Medication Sig   buPROPion  (WELLBUTRIN  XL) 300 MG 24 hr tablet Take 1 tablet (300 mg total) by mouth daily.   levonorgestrel  (MIRENA ) 20 MCG/DAY IUD 1 each by Intrauterine route once.   "

## 2024-12-24 DIAGNOSIS — R42 Dizziness and giddiness: Secondary | ICD-10-CM

## 2024-12-24 DIAGNOSIS — R002 Palpitations: Secondary | ICD-10-CM

## 2024-12-25 ENCOUNTER — Ambulatory Visit: Payer: Self-pay

## 2024-12-30 ENCOUNTER — Ambulatory Visit: Payer: Self-pay | Admitting: Cardiology

## 2025-01-05 ENCOUNTER — Ambulatory Visit: Payer: Self-pay | Admitting: Physician Assistant

## 2025-01-08 ENCOUNTER — Telehealth: Payer: Self-pay

## 2025-01-08 NOTE — Telephone Encounter (Signed)
Monitor Results reviewed with pt as per Dr. Wendy Poet note.  Pt verbalized understanding and had no additional questions. Routed to PCP

## 2025-01-28 ENCOUNTER — Ambulatory Visit: Payer: Self-pay

## 2025-01-30 ENCOUNTER — Ambulatory Visit: Payer: Self-pay | Admitting: Cardiology
